# Patient Record
Sex: Female | Born: 1955 | Race: White | Hispanic: No | Marital: Single | Smoking: Current every day smoker
Health system: Southern US, Community
[De-identification: ages and names within clinical notes are randomized; demographics above are authoritative.]

## PROBLEM LIST (undated history)

## (undated) DIAGNOSIS — I10 Essential (primary) hypertension: Secondary | ICD-10-CM

## (undated) DIAGNOSIS — K7460 Unspecified cirrhosis of liver: Secondary | ICD-10-CM

## (undated) HISTORY — PX: ABDOMINAL HYSTERECTOMY: SHX81

---

## 2005-01-17 ENCOUNTER — Emergency Department: Payer: Self-pay | Admitting: Emergency Medicine

## 2005-07-24 ENCOUNTER — Emergency Department: Payer: Self-pay | Admitting: Emergency Medicine

## 2006-02-10 ENCOUNTER — Emergency Department: Payer: Self-pay | Admitting: Internal Medicine

## 2010-04-03 ENCOUNTER — Emergency Department: Payer: Self-pay | Admitting: Emergency Medicine

## 2010-12-31 ENCOUNTER — Emergency Department: Payer: Self-pay | Admitting: Emergency Medicine

## 2011-06-16 ENCOUNTER — Emergency Department: Payer: Self-pay | Admitting: Emergency Medicine

## 2011-06-30 ENCOUNTER — Inpatient Hospital Stay: Payer: Self-pay | Admitting: Internal Medicine

## 2011-07-06 ENCOUNTER — Encounter: Payer: Self-pay | Admitting: *Deleted

## 2011-07-06 ENCOUNTER — Observation Stay (HOSPITAL_COMMUNITY)
Admission: EM | Admit: 2011-07-06 | Discharge: 2011-07-08 | Disposition: A | Discharge status: Nursing Home (Includes ICF) | Payer: Medicaid Other | Attending: Internal Medicine | Admitting: Internal Medicine

## 2011-07-06 DIAGNOSIS — K7460 Unspecified cirrhosis of liver: Secondary | ICD-10-CM | POA: Diagnosis present

## 2011-07-06 DIAGNOSIS — Z23 Encounter for immunization: Secondary | ICD-10-CM | POA: Insufficient documentation

## 2011-07-06 DIAGNOSIS — R0602 Shortness of breath: Secondary | ICD-10-CM | POA: Insufficient documentation

## 2011-07-06 DIAGNOSIS — Z9181 History of falling: Secondary | ICD-10-CM | POA: Insufficient documentation

## 2011-07-06 DIAGNOSIS — R5381 Other malaise: Secondary | ICD-10-CM | POA: Insufficient documentation

## 2011-07-06 DIAGNOSIS — F17200 Nicotine dependence, unspecified, uncomplicated: Secondary | ICD-10-CM | POA: Insufficient documentation

## 2011-07-06 DIAGNOSIS — R531 Weakness: Secondary | ICD-10-CM

## 2011-07-06 DIAGNOSIS — D649 Anemia, unspecified: Secondary | ICD-10-CM | POA: Diagnosis present

## 2011-07-06 DIAGNOSIS — R188 Other ascites: Secondary | ICD-10-CM | POA: Diagnosis present

## 2011-07-06 DIAGNOSIS — R5383 Other fatigue: Secondary | ICD-10-CM | POA: Insufficient documentation

## 2011-07-06 DIAGNOSIS — R262 Difficulty in walking, not elsewhere classified: Secondary | ICD-10-CM | POA: Insufficient documentation

## 2011-07-06 DIAGNOSIS — F1020 Alcohol dependence, uncomplicated: Secondary | ICD-10-CM

## 2011-07-06 DIAGNOSIS — K7030 Alcoholic cirrhosis of liver without ascites: Secondary | ICD-10-CM | POA: Insufficient documentation

## 2011-07-06 DIAGNOSIS — N390 Urinary tract infection, site not specified: Secondary | ICD-10-CM | POA: Insufficient documentation

## 2011-07-06 DIAGNOSIS — M545 Low back pain, unspecified: Secondary | ICD-10-CM

## 2011-07-06 DIAGNOSIS — I10 Essential (primary) hypertension: Secondary | ICD-10-CM | POA: Insufficient documentation

## 2011-07-06 DIAGNOSIS — R110 Nausea: Secondary | ICD-10-CM | POA: Insufficient documentation

## 2011-07-06 DIAGNOSIS — W19XXXA Unspecified fall, initial encounter: Secondary | ICD-10-CM | POA: Diagnosis present

## 2011-07-06 HISTORY — DX: Essential (primary) hypertension: I10

## 2011-07-06 HISTORY — DX: Unspecified cirrhosis of liver: K74.60

## 2011-07-06 LAB — PROTIME-INR: Prothrombin Time: 20.3 seconds — ABNORMAL HIGH (ref 11.6–15.2)

## 2011-07-06 LAB — AMMONIA: Ammonia: 16 umol/L (ref 11–60)

## 2011-07-06 LAB — BASIC METABOLIC PANEL
CO2: 20 mEq/L (ref 19–32)
Chloride: 104 mEq/L (ref 96–112)
Creatinine, Ser: 1.01 mg/dL (ref 0.50–1.10)
Glucose, Bld: 136 mg/dL — ABNORMAL HIGH (ref 70–99)

## 2011-07-06 LAB — CBC
Hemoglobin: 10.5 g/dL — ABNORMAL LOW (ref 12.0–15.0)
MCH: 36.3 pg — ABNORMAL HIGH (ref 26.0–34.0)
MCHC: 34.9 g/dL (ref 30.0–36.0)
Platelets: 110 10*3/uL — ABNORMAL LOW (ref 150–400)
RDW: 17.9 % — ABNORMAL HIGH (ref 11.5–15.5)

## 2011-07-06 LAB — HEPATIC FUNCTION PANEL
ALT: 45 U/L — ABNORMAL HIGH (ref 0–35)
Albumin: 2.7 g/dL — ABNORMAL LOW (ref 3.5–5.2)
Alkaline Phosphatase: 70 U/L (ref 39–117)
Indirect Bilirubin: 2.2 mg/dL — ABNORMAL HIGH (ref 0.3–0.9)
Total Bilirubin: 3.3 mg/dL — ABNORMAL HIGH (ref 0.3–1.2)
Total Protein: 7 g/dL (ref 6.0–8.3)

## 2011-07-06 LAB — DIFFERENTIAL
Basophils Absolute: 0.1 10*3/uL (ref 0.0–0.1)
Basophils Relative: 1 % (ref 0–1)
Eosinophils Absolute: 0.3 10*3/uL (ref 0.0–0.7)
Monocytes Relative: 18 % — ABNORMAL HIGH (ref 3–12)
Neutro Abs: 5.6 10*3/uL (ref 1.7–7.7)
Neutrophils Relative %: 61 % (ref 43–77)

## 2011-07-06 LAB — SAMPLE TO BLOOD BANK

## 2011-07-06 LAB — RAPID URINE DRUG SCREEN, HOSP PERFORMED
Amphetamines: NOT DETECTED
Barbiturates: NOT DETECTED
Benzodiazepines: POSITIVE — AB

## 2011-07-06 LAB — CARDIAC PANEL(CRET KIN+CKTOT+MB+TROPI): Relative Index: 4.3 — ABNORMAL HIGH (ref 0.0–2.5)

## 2011-07-06 MED ORDER — SODIUM CHLORIDE 0.9 % IV SOLN
INTRAVENOUS | Status: AC
  Administered 2011-07-06: 22:00:00 via INTRAVENOUS

## 2011-07-06 MED ORDER — MORPHINE SULFATE 2 MG/ML IJ SOLN
2.0000 mg | Freq: Once | INTRAMUSCULAR | Status: AC
  Administered 2011-07-06: 2 mg via INTRAVENOUS
  Filled 2011-07-06: qty 1

## 2011-07-06 NOTE — ED Notes (Signed)
The pt was discharged from [facility] yesterday and the daughter with her wants nursing home placement because she cannot care for herself and the daughter says she cannot stay with her.  She has had numerus complaints.  She has been having diarrhea and she has no control over her bowels or bladder.  The pt has cirrhosis

## 2011-07-06 NOTE — ED Notes (Signed)
Urine specimen collected and sent to lab. Awaiting results.

## 2011-07-06 NOTE — ED Notes (Signed)
MD at bedside. 

## 2011-07-06 NOTE — ED Notes (Signed)
Soda provided per Pt request.

## 2011-07-06 NOTE — ED Notes (Signed)
Attempted to call report - nuse unavailable at present - in report - will call later.

## 2011-07-06 NOTE — ED Notes (Signed)
Spoke with pt's daughter in Triage while pt waiting to be seen.  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  Dtr. Reports that she lives in a roommate situation and cannot care for pt in that environment.  Dtr. Works and goes to school and no one else in the family willing to care for pt.  Per dtr., pt is incontinent of urine/stool and has limited bed mobility/ambulation ability.  Pt would be inappropriate for homeless shelter if the above info is accurate.  Pt's dtr. Believes that pt needs NH/ALF placement at this point because she has no one to care for her and she cannot care for herself.  Pt is in the process of applying for Medicaid/disability and would require a letter of guarantee from [facility] for placement.  Emotional support given to pt's dtr.

## 2011-07-06 NOTE — ED Notes (Signed)
Took Pt to bathroom via wheelchair to collect urine specimen.  Hat placed inm toilet - Pt missed hat altogether.  Will attempt again with bed pan.

## 2011-07-06 NOTE — H&P (Signed)
Alyssa Soto is an 55 y.o. female.   Chief Complain: Weakness HPI: 55 year old female, who was just discharged from [facility] yesterday after being treated for hepatic encephalopathy and alcohol withdrawal came to the ER today because of weakness and inability to take care of herself. Patient states she was at the [facility] for a week. She did not know when she had gone there because of the confusion. Presently she has been treated with Lasix and lactulose patient is also on Levaquin for UTI. Patient states she's been feeling weak and difficult to walk and she has had falls. Patient states she does have low back pain which has been ongoing for last 2-3 weeks. Patient states she had CAT scan done for her back at the [facility] which as per patient did not show anything acute. Patient still has low back pain when she moves or puts pressure. She denies any chest pain shortness of breath cough or phlegm abdominal pain fever chills or diarrhea. She does have distended abdomen denies any pain at this time.  Past Medical History  Diagnosis Date  . Liver cirrhosis   . Hypertension     Past Surgical History  Procedure Date  . Abdominal hysterectomy     Family History  Problem Relation Age of Onset  . Coronary artery disease Father   . Diabetes type II Sister    Social History:  reports that she has been smoking.  She does not have any smokeless tobacco history on file. She reports that she drinks alcohol. Her drug history not on file.  Allergies:  Allergies  Allergen Reactions  . Acetaminophen     Due to liver condition  . Ampicillin Rash  . Codeine Rash    Medications Prior to Admission  Medication Dose Route Frequency Provider Last Rate Last Dose  . 0.9 %  sodium chloride infusion   Intravenous STAT Geoffery Lyons, MD 100 mL/hr at 07/06/11 2213    . morphine 2 MG/ML injection 2 mg  2 mg Intravenous Once Geoffery Lyons, MD   2 mg at 07/06/11 2214   No current  outpatient prescriptions on file as of 07/06/2011.    Results for orders placed during the hospital encounter of 07/06/11 (from the past 48 hour(s))  CBC     Status: Abnormal   Collection Time   07/06/11  6:45 PM      Component Value Range Comment   WBC 9.1  4.0 - 10.5 (K/uL)    RBC 2.89 (*) 3.87 - 5.11 (MIL/uL)    Hemoglobin 10.5 (*) 12.0 - 15.0 (g/dL)    HCT 51.8 (*) 84.1 - 46.0 (%)    MCV 104.2 (*) 78.0 - 100.0 (fL)    MCH 36.3 (*) 26.0 - 34.0 (pg)    MCHC 34.9  30.0 - 36.0 (g/dL)    RDW 66.0 (*) 63.0 - 15.5 (%)    Platelets 110 (*) 150 - 400 (K/uL) PLATELET COUNT CONFIRMED BY SMEAR  DIFFERENTIAL     Status: Abnormal   Collection Time   07/06/11  6:45 PM      Component Value Range Comment   Neutrophils Relative 61  43 - 77 (%)    Neutro Abs 5.6  1.7 - 7.7 (K/uL)    Lymphocytes Relative 17  12 - 46 (%)    Lymphs Abs 1.6  0.7 - 4.0 (K/uL)    Monocytes Relative 18 (*) 3 - 12 (%)    Monocytes Absolute 1.7 (*) 0.1 -  1.0 (K/uL)    Eosinophils Relative 3  0 - 5 (%)    Eosinophils Absolute 0.3  0.0 - 0.7 (K/uL)    Basophils Relative 1  0 - 1 (%)    Basophils Absolute 0.1  0.0 - 0.1 (K/uL)   CARDIAC PANEL(CRET KIN+CKTOT+MB+TROPI)     Status: Abnormal   Collection Time   07/06/11  6:48 PM      Component Value Range Comment   Total CK 113  7 - 177 (U/L)    CK, MB 4.9 (*) 0.3 - 4.0 (ng/mL)    Troponin I <0.30  <0.30 (ng/mL)    Relative Index 4.3 (*) 0.0 - 2.5    SAMPLE TO BLOOD BANK     Status: Normal   Collection Time   07/06/11  7:00 PM      Component Value Range Comment   Blood Bank Specimen SAMPLE AVAILABLE FOR TESTING      Sample Expiration 07/07/2011     BASIC METABOLIC PANEL     Status: Abnormal   Collection Time   07/06/11  7:08 PM      Component Value Range Comment   Sodium 133 (*) 135 - 145 (mEq/L)    Potassium 4.1  3.5 - 5.1 (mEq/L)    Chloride 104  96 - 112 (mEq/L)    CO2 20  19 - 32 (mEq/L)    Glucose, Bld 136 (*) 70 - 99 (mg/dL)    BUN 13  6 - 23 (mg/dL)     Creatinine, Ser 1.61  0.50 - 1.10 (mg/dL)    Calcium 8.9  8.4 - 10.5 (mg/dL)    GFR calc non Af Amer 62 (*) >90 (mL/min)    GFR calc Af Amer 72 (*) >90 (mL/min)   HEPATIC FUNCTION PANEL     Status: Abnormal   Collection Time   07/06/11  7:08 PM      Component Value Range Comment   Total Protein 7.0  6.0 - 8.3 (g/dL)    Albumin 2.7 (*) 3.5 - 5.2 (g/dL)    AST 096 (*) 0 - 37 (U/L)    ALT 45 (*) 0 - 35 (U/L)    Alkaline Phosphatase 70  39 - 117 (U/L)    Total Bilirubin 3.3 (*) 0.3 - 1.2 (mg/dL)    Bilirubin, Direct 1.1 (*) 0.0 - 0.3 (mg/dL)    Indirect Bilirubin 2.2 (*) 0.3 - 0.9 (mg/dL)   ETHANOL     Status: Normal   Collection Time   07/06/11  7:08 PM      Component Value Range Comment   Alcohol, Ethyl (B) <11  0 - 11 (mg/dL)   PROTIME-INR     Status: Abnormal   Collection Time   07/06/11  7:08 PM      Component Value Range Comment   Prothrombin Time 20.3 (*) 11.6 - 15.2 (seconds)    INR 1.70 (*) 0.00 - 1.49    APTT     Status: Normal   Collection Time   07/06/11  7:08 PM      Component Value Range Comment   aPTT 34  24 - 37 (seconds)   AMMONIA     Status: Normal   Collection Time   07/06/11  7:10 PM      Component Value Range Comment   Ammonia 16  11 - 60 (umol/L)   URINE RAPID DRUG SCREEN (HOSP PERFORMED)     Status: Abnormal   Collection Time   07/06/11  8:05  PM      Component Value Range Comment   Opiates NONE DETECTED  NONE DETECTED     Cocaine NONE DETECTED  NONE DETECTED     Benzodiazepines POSITIVE (*) NONE DETECTED     Amphetamines NONE DETECTED  NONE DETECTED     Tetrahydrocannabinol NONE DETECTED  NONE DETECTED     Barbiturates NONE DETECTED  NONE DETECTED     No results found.  Review of Systems  Constitutional: Positive for malaise/fatigue.  HENT: Negative.   Eyes: Negative.   Respiratory: Negative.   Cardiovascular: Negative.   Gastrointestinal: Negative.   Genitourinary: Negative.   Musculoskeletal: Positive for back pain.  Skin: Negative.     Neurological: Positive for weakness.  Endo/Heme/Allergies: Negative.   Psychiatric/Behavioral: Negative.     Blood pressure 124/73, pulse 96, temperature 98.8 F (37.1 C), temperature source Oral, resp. rate 20, SpO2 96.00%. Physical Exam  Constitutional: She is oriented to person, place, and time. She appears well-developed and well-nourished.  HENT:  Head: Normocephalic and atraumatic.  Right Ear: External ear normal.  Left Ear: External ear normal.  Nose: Nose normal.  Mouth/Throat: Oropharynx is clear and moist.  Eyes: Conjunctivae and EOM are normal. Pupils are equal, round, and reactive to light.  Neck: Normal range of motion. Neck supple.  Cardiovascular: Normal rate, regular rhythm, normal heart sounds and intact distal pulses.   Respiratory: Effort normal and breath sounds normal.  GI: Soft. Bowel sounds are normal. There is no tenderness.       Distended abdomen.  Musculoskeletal: Normal range of motion.  Neurological: She is alert and oriented to person, place, and time. She has normal reflexes.  Skin: Skin is warm and dry.  Psychiatric: Her behavior is normal.     Assessment/Plan #1. Generalized weakness from deconditioning. #2. Alcoholic liver cirrhosis. #3. Recently discharged from [facility] after being treated for Hepatic encephalopathy and alcohol withdrawal. #4. Mild coagulopathy and thrombocytopenia probably from cirrhosis of liver. #5. Recently treated UTI on Levaquin. #6. Low back pain. #7. Ascites. #8. Cigarette smoker.  Plan Will admit to medical floor. For her weakness which I think is from deconditioning we'll get PT consult. Patient does have ascites with distended abdomen. Abdomen is nontender, patient is afebrile. We'll order sonogram guided paracentesis and send fluid for cell count and differential and for other labs. For now we will continue her Levaquin, Lasix and lactulose. Further recommendations as condition  evolves.     Eduard Clos 07/06/2011, 10:56 PM

## 2011-07-06 NOTE — ED Provider Notes (Signed)
History     CSN: [id number] Arrival date & time: 07/06/2011  4:05 PM   First MD Initiated Contact with Patient 07/06/11 1843      Chief Complaint  Patient presents with  . Back Pain    (Consider location/radiation/quality/duration/timing/severity/associated sxs/prior treatment) HPI Comments: This patient was diagnosed two weeks ago with advanced cirrhosis at [facility].  She was to be transferred to inpatient alcohol treatment, however they were unable to arrange placement.  She was discharged yesterday.  Since yesterday, the patient has been unable to walk or care for self.  She is having diarrhea from her lactulose and the daughter is unable to care for her.    Patient is a 55 y.o. female presenting with weakness. The history is provided by the patient and a relative.  Weakness The primary symptoms include nausea.  Additional symptoms include weakness.    Past Medical History  Diagnosis Date  . Liver cirrhosis   . Hypertension     History reviewed. No pertinent past surgical history.  History reviewed. No pertinent family history.  History  Substance Use Topics  . Smoking status: Current Everyday Smoker  . Smokeless tobacco: Not on file  . Alcohol Use: Yes    OB History    Grav Para Term Preterm Abortions TAB SAB Ect Mult Living                  Review of Systems  Gastrointestinal: Positive for nausea.  Neurological: Positive for weakness.  All other systems reviewed and are negative.    Allergies  Acetaminophen; Ampicillin; and Codeine  Home Medications   Current Outpatient Rx  Name Route Sig Dispense Refill  . CITALOPRAM HYDROBROMIDE 20 MG PO TABS Oral Take 20 mg by mouth daily.      Marland Kitchen FOLIC ACID 1 MG PO TABS Oral Take 1 mg by mouth daily.      . FUROSEMIDE 40 MG PO TABS Oral Take 40 mg by mouth 2 (two) times daily.      . IBUPROFEN 200 MG PO TABS Oral Take 200 mg by mouth every 8 (eight) hours as needed. pain     . LACTULOSE 10 GM/15ML PO  SOLN Oral Take 10 g by mouth 3 (three) times daily. For bowel movements     . LEVOFLOXACIN 250 MG PO TABS Oral Take 250 mg by mouth daily. Take for 3 days beginning 07/05/11     . THERA M PLUS PO TABS Oral Take 1 tablet by mouth 2 (two) times daily.      Marland Kitchen POTASSIUM CHLORIDE CRYS CR 20 MEQ PO TBCR Oral Take 20 mEq by mouth 2 (two) times daily.        BP 153/75  Pulse 87  Temp(Src) 99 F (37.2 C) (Oral)  Resp 16  SpO2 97%  Physical Exam  Constitutional: She is oriented to person, place, and time.       Pale, mildly jaundiced.  HENT:  Head: Normocephalic and atraumatic.  Eyes: Pupils are equal, round, and reactive to light. Scleral icterus is present.  Neck: Normal range of motion. Neck supple.  Cardiovascular: Normal rate and regular rhythm.  Exam reveals no gallop and no friction rub.   No murmur heard. Pulmonary/Chest: Effort normal and breath sounds normal. No respiratory distress. She has no wheezes. She has no rales.  Abdominal: She exhibits distension.  Musculoskeletal: Normal range of motion.  Neurological: She is alert and oriented to person, place, and time.  Skin:  Jaundiced.    ED Course  Procedures (including critical care time)   Labs Reviewed  CBC  DIFFERENTIAL  COMPREHENSIVE METABOLIC PANEL  SAMPLE TO BLOOD BANK  CARDIAC PANEL(CRET KIN+CKTOT+MB+TROPI)   No results found.   No diagnosis found.    MDM  Patient was recently diagnosed with end stage liver failure.  She is too weak to get around on her own and the daughter is unable to care for her.  The labs reflect advanced liver disease but show no acute process.  Will consult medicine for admission.        Geoffery Lyons, MD 07/06/11 2158

## 2011-07-07 ENCOUNTER — Inpatient Hospital Stay (HOSPITAL_COMMUNITY): Payer: Medicaid Other

## 2011-07-07 DIAGNOSIS — W19XXXA Unspecified fall, initial encounter: Secondary | ICD-10-CM | POA: Diagnosis present

## 2011-07-07 LAB — CBC
Hemoglobin: 9.5 g/dL — ABNORMAL LOW (ref 12.0–15.0)
MCH: 35.6 pg — ABNORMAL HIGH (ref 26.0–34.0)
MCV: 104.5 fL — ABNORMAL HIGH (ref 78.0–100.0)
Platelets: 104 10*3/uL — ABNORMAL LOW (ref 150–400)
RBC: 2.67 MIL/uL — ABNORMAL LOW (ref 3.87–5.11)
WBC: 7.7 10*3/uL (ref 4.0–10.5)

## 2011-07-07 LAB — COMPREHENSIVE METABOLIC PANEL
AST: 92 U/L — ABNORMAL HIGH (ref 0–37)
Albumin: 2.4 g/dL — ABNORMAL LOW (ref 3.5–5.2)
BUN: 12 mg/dL (ref 6–23)
Calcium: 8.2 mg/dL — ABNORMAL LOW (ref 8.4–10.5)
Chloride: 109 mEq/L (ref 96–112)
Creatinine, Ser: 1.05 mg/dL (ref 0.50–1.10)
Total Bilirubin: 2.7 mg/dL — ABNORMAL HIGH (ref 0.3–1.2)

## 2011-07-07 LAB — PROTIME-INR
INR: 1.85 — ABNORMAL HIGH (ref 0.00–1.49)
Prothrombin Time: 21.7 seconds — ABNORMAL HIGH (ref 11.6–15.2)

## 2011-07-07 LAB — MAGNESIUM: Magnesium: 1.5 mg/dL (ref 1.5–2.5)

## 2011-07-07 LAB — AMMONIA: Ammonia: 27 umol/L (ref 11–60)

## 2011-07-07 MED ORDER — CITALOPRAM HYDROBROMIDE 20 MG PO TABS
20.0000 mg | ORAL_TABLET | Freq: Every day | ORAL | Status: DC
  Administered 2011-07-07 – 2011-07-08 (×2): 20 mg via ORAL
  Filled 2011-07-07 (×2): qty 1

## 2011-07-07 MED ORDER — MORPHINE SULFATE 2 MG/ML IJ SOLN
2.0000 mg | Freq: Once | INTRAMUSCULAR | Status: AC
  Administered 2011-07-07: 2 mg via INTRAVENOUS
  Filled 2011-07-07: qty 1

## 2011-07-07 MED ORDER — SODIUM CHLORIDE 0.9 % IV SOLN: 250.0000 mL | INTRAVENOUS | Status: DC | PRN

## 2011-07-07 MED ORDER — SODIUM CHLORIDE 0.9 % IJ SOLN
3.0000 mL | Freq: Two times a day (BID) | INTRAMUSCULAR | Status: DC
  Administered 2011-07-07 – 2011-07-08 (×2): 3 mL via INTRAVENOUS

## 2011-07-07 MED ORDER — OXYCODONE HCL 5 MG PO TABS
5.0000 mg | ORAL_TABLET | Freq: Once | ORAL | Status: AC
  Administered 2011-07-08: 5 mg via ORAL
  Filled 2011-07-07 (×2): qty 1

## 2011-07-07 MED ORDER — SODIUM CHLORIDE 0.9 % IJ SOLN
3.0000 mL | INTRAMUSCULAR | Status: DC | PRN
  Administered 2011-07-08: 3 mL via INTRAVENOUS

## 2011-07-07 MED ORDER — ONDANSETRON HCL 4 MG PO TABS: 4.0000 mg | ORAL_TABLET | Freq: Four times a day (QID) | ORAL | Status: DC | PRN

## 2011-07-07 MED ORDER — LEVOFLOXACIN 250 MG PO TABS
250.0000 mg | ORAL_TABLET | Freq: Every day | ORAL | Status: DC
  Administered 2011-07-07 – 2011-07-08 (×2): 250 mg via ORAL
  Filled 2011-07-07 (×2): qty 1

## 2011-07-07 MED ORDER — TUBERCULIN PPD 5 UNIT/0.1ML ID SOLN
5.0000 [IU] | Freq: Once | INTRADERMAL | Status: AC
  Administered 2011-07-07: 5 [IU] via INTRADERMAL
  Filled 2011-07-07: qty 0.1

## 2011-07-07 MED ORDER — MORPHINE SULFATE 2 MG/ML IJ SOLN
0.5000 mg | Freq: Once | INTRAMUSCULAR | Status: AC
  Administered 2011-07-07: 02:00:00 via INTRAVENOUS
  Filled 2011-07-07: qty 1

## 2011-07-07 MED ORDER — FUROSEMIDE 40 MG PO TABS
40.0000 mg | ORAL_TABLET | Freq: Two times a day (BID) | ORAL | Status: DC
  Administered 2011-07-07 – 2011-07-08 (×3): 40 mg via ORAL
  Filled 2011-07-07 (×5): qty 1

## 2011-07-07 MED ORDER — ONDANSETRON HCL 4 MG/2ML IJ SOLN: 4.0000 mg | Freq: Four times a day (QID) | INTRAMUSCULAR | Status: DC | PRN

## 2011-07-07 MED ORDER — PNEUMOCOCCAL VAC POLYVALENT 25 MCG/0.5ML IJ INJ
0.5000 mL | INJECTION | INTRAMUSCULAR | Status: AC
  Administered 2011-07-08: 0.5 mL via INTRAMUSCULAR
  Filled 2011-07-07: qty 0.5

## 2011-07-07 MED ORDER — IBUPROFEN 200 MG PO TABS
200.0000 mg | ORAL_TABLET | Freq: Three times a day (TID) | ORAL | Status: DC | PRN
  Administered 2011-07-07 – 2011-07-08 (×3): 200 mg via ORAL
  Filled 2011-07-07 (×3): qty 1

## 2011-07-07 MED ORDER — MORPHINE SULFATE 15 MG PO TABS
15.0000 mg | ORAL_TABLET | Freq: Once | ORAL | Status: AC
  Administered 2011-07-07: 15 mg via ORAL
  Filled 2011-07-07: qty 1

## 2011-07-07 MED ORDER — FOLIC ACID 1 MG PO TABS
1.0000 mg | ORAL_TABLET | Freq: Every day | ORAL | Status: DC
  Administered 2011-07-07 – 2011-07-08 (×2): 1 mg via ORAL
  Filled 2011-07-07 (×2): qty 1

## 2011-07-07 MED ORDER — THERA M PLUS PO TABS
1.0000 | ORAL_TABLET | Freq: Two times a day (BID) | ORAL | Status: DC
  Administered 2011-07-07 – 2011-07-08 (×3): 1 via ORAL
  Filled 2011-07-07 (×4): qty 1

## 2011-07-07 MED ORDER — POTASSIUM CHLORIDE CRYS ER 20 MEQ PO TBCR
20.0000 meq | EXTENDED_RELEASE_TABLET | Freq: Two times a day (BID) | ORAL | Status: DC
  Administered 2011-07-07 – 2011-07-08 (×3): 20 meq via ORAL
  Filled 2011-07-07 (×4): qty 1

## 2011-07-07 MED ORDER — LACTULOSE 10 GM/15ML PO SOLN
10.0000 g | Freq: Three times a day (TID) | ORAL | Status: DC
  Administered 2011-07-07 – 2011-07-08 (×4): 10 g via ORAL
  Filled 2011-07-07 (×6): qty 15

## 2011-07-07 MED ORDER — WHITE PETROLATUM GEL
Status: AC
  Filled 2011-07-07: qty 5

## 2011-07-07 NOTE — Discharge Summary (Addendum)
Alyssa Soto MRN: [id number] DOB/AGE: 1956/06/07 55 y.o.  Admit date: 07/06/2011 Discharge date: 07/07/2011. Patient initially scheduled for discharge on 06/29/2011 however facility could not accept her until 07/08/2011 this patient be discharged on 07/08/2011.  Primary Care Physician:  No primary provider on file.   Discharge Diagnoses:   Patient Active Problem List  Diagnoses  . Weakness  . Cirrhosis of liver  . Anemia  . Ascites  . Low back pain  . Fall    DISCHARGE MEDICATION: Current Discharge Medication List    CONTINUE these medications which have NOT CHANGED   Details  citalopram (CELEXA) 20 MG tablet Take 20 mg by mouth daily.      folic acid (FOLVITE) 1 MG tablet Take 1 mg by mouth daily.      furosemide (LASIX) 40 MG tablet Take 40 mg by mouth 2 (two) times daily.      ibuprofen (ADVIL,MOTRIN) 200 MG tablet Take 200 mg by mouth every 8 (eight) hours as needed. pain     lactulose (CHRONULAC) 10 GM/15ML solution Take 10 g by mouth 3 (three) times daily. For bowel movements     levofloxacin (LEVAQUIN) 250 MG tablet Take 250 mg by mouth daily. Take for 3 days beginning 07/05/11     Multiple Vitamins-Minerals (MULTIVITAMINS THER. W/MINERALS) TABS Take 1 tablet by mouth 2 (two) times daily.      potassium chloride SA (K-DUR,KLOR-CON) 20 MEQ tablet Take 20 mEq by mouth 2 (two) times daily.              BRIEF ADMITTING H & P:55 year old female, who was just discharged from [facility] yesterday after being treated for hepatic encephalopathy and alcohol withdrawal came to the ER today because of weakness and inability to take care of herself. Patient states she was at the [facility] for a week. She did not know when she had gone there because of the confusion over per the patient and her daughter at the time of discharge she was completely clear and her mentation. The daughter's motivation for bringing the patient to [facility] is that she  states that the patient has no place to stay and could not stay with her daughter as she would be violation of her lease as the patient has a infectious disease in the form of hepatitis C as reported by the patient and her daughter. According to the patient's daughter she noted that the patient had weakness could not get in and out of bed by herself and had an accident a bowel movement on the floor when she tried to clean up and then could not get up from the floor.  Presently she has been treated with Lasix and lactulose and patient is also on Levaquin for UTI. Patient states she's been feeling weak and difficult to walk and she has had falls. Patient states she does have low back pain which has been ongoing for last 2-3 months. Patient states she had CAT scan done for her back at the [facility] which as per patient did not show anything acute. She denies any chest pain shortness of breath cough or phlegm abdominal pain fever chills or diarrhea. She does have distended abdomen denies any pain at this time.    Hospital Course:  Present on Admission:  .Weakness: The patient's demonstrates no acute weakness. She's been seen by physical therapy. The patient is ambulating the hall without any assistance and was able to perform bed mobility without any assistance. I also  discussed the patient's ambulation and bed mobility washes were physical therapy.  .Cirrhosis of liver: The patient has a diagnosis of cirrhosis. The patient is well compensated at this time. She has no abdominal pain except for some mild liver tenderness. The patient has good oral intake and a GI lamination without any difficulty and she has no respiratory embarrassment. .Anemia: The patient has no evidence of active bleeding and her hemoglobin is stable between yesterday and today.  .Ascites: Clinically the patient has ascites as is expected with her cirrhosis and hepatitis. She has an appointment to followup with outpatient GI in 2  weeks.  .Fall: Per report of the patient's daughter the patient did not actually fall but that down on the floor to clean up her stool and could not get up. EtOH habituation: The patient has been counseled against any further alcohol use. UTI: The patient was diagnosed with a urinary tract infection mild [facility]. She was started on Levaquin on 11/26 to complete at 11/29. Psychosocial: Presently this patient has no acute medical needs. However the patient also appropriately discharged by [facility] has no place to stay. The patient apparently was living with a friend and has lost her prehospital living arrangement and her daughter is unable to provide accommodations for her to stay. I've asked the social worker to see the patient to assist in discharging the patient.  Patient is medically stable for discharge.  Disposition and Follow-up: Patient being discharged to [facility] assisted living facility. The patient has a followup appointment with [facility] on 07/19/2011 at 12 noon. This is been confirmed with her daughter Ms. Benito Mccreedy.   DISCHARGE EXAM: Blood pressure 104/66, pulse 91, temperature 99.3 F (37.4 C), temperature source Oral, resp. rate 20, height 5\' 2"  (1.575 m), weight 56.5 kg (124 lb 9 oz), SpO2 93.00%. General: Alert, awake, oriented x3, in no acute distress.  HEENT: [place]/AT PEERL, EOMI Neck: Trachea midline,  no masses, no thyromegal,y no JVD, no carotid bruit OROPHARYNX:  Moist, No exudate/ erythema/lesions.  Heart: Regular rate and rhythm, without murmurs, rubs, gallops, PMI non-displaced, no heaves or thrills on palpation.  Lungs: Clear to auscultation, no wheezing or rhonchi noted. No increased vocal fremitus resonant to percussion  Abdomen: Soft, mild tenderness over the liver. Patient has clinical ascites, positive bowel sounds, no masses, however enlarged liver  noted.  Neuro: No focal neurological deficits noted cranial nerves II  through XII grossly intact. DTRs 2+ bilaterally upper and lower extremities. Strength 3+/ 5 in bilateral upper and lower extremities. Musculoskeletal: No warm swelling or erythema around joints, no spinal tenderness noted. Psychiatric: Patient alert and oriented x3, good insight and cognition, good recent to remote recall. Lymph node survey: No cervical axillary or inguinal lymphadenopathy noted.   Basename 07/07/11 0605 07/06/11 1908  NA 138 133*  K 3.7 4.1  CL 109 104  CO2 20 20  GLUCOSE 125* 136*  BUN 12 13  CREATININE 1.05 1.01  CALCIUM 8.2* 8.9  MG 1.5 --  PHOS -- --    [facility] 07/07/11 0605 07/06/11 1908  AST 92* 106*  ALT 42* 45*  ALKPHOS 67 70  BILITOT 2.7* 3.3*  PROT 6.3 7.0  ALBUMIN 2.4* 2.7*   No results found for this basename: LIPASE:2,AMYLASE:2 in the last 72 hours  Basename 07/07/11 0605 07/06/11 1845  WBC 7.7 9.1  NEUTROABS -- 5.6  HGB 9.5* 10.5*  HCT 27.9* 30.1*  MCV 104.5* 104.2*  PLT 104* 110*  Signed: Liston Thum A. 07/07/2011, 3:03 PM

## 2011-07-07 NOTE — Progress Notes (Signed)
Physical Therapy Evaluation Patient Details Name: KASHARA BLOCHER MRN: [id number] DOB: 01/28/1956 Today's Date: 07/07/2011  Problem List:  Patient Active Problem List  Diagnoses  . Weakness  . Cirrhosis of liver  . Anemia  . Ascites  . Low back pain  . Fall    Past Medical History:  Past Medical History  Diagnosis Date  . Liver cirrhosis   . Hypertension    Past Surgical History:  Past Surgical History  Procedure Date  . Abdominal hysterectomy     PT Assessment/Plan/Recommendation PT Assessment Clinical Impression Statement: Pt presents with difficulty donning sock and shoes secondary to Low back pain..  No significant impairment to mobility.   PT Recommendation/Assessment: Patent does not need any further PT services No Skilled PT: Patient is independent with all acitivity/mobility PT Goals     PT Evaluation Precautions/Restrictions  Precautions Precautions: Fall Restrictions Weight Bearing Restrictions: No Prior Functioning  Home Living Lives With: Alone (may go to daughters home) Receives Help From: Other (Comment) (unclear of Dishcharge enviroment may not stay with daughter) Type of Home: Other (Comment) Home Layout: Other (Comment) Prior Function Able to Take Stairs?: Yes Driving: Yes Cognition Cognition Arousal/Alertness: Awake/alert Overall Cognitive Status: Appears within functional limits for tasks assessed Sensation/Coordination Sensation Light Touch: Appears Intact Coordination Gross Motor Movements are Fluid and Coordinated: Yes Extremity Assessment RUE Assessment RUE Assessment: Within Functional Limits LUE Assessment LUE Assessment: Within Functional Limits RLE Assessment RLE Assessment: Within Functional Limits LLE Assessment LLE Assessment: Within Functional Limits Mobility (including Balance) Bed Mobility Bed Mobility: Yes Rolling Right: 7: Independent Rolling Left: 7: Independent Left Sidelying to Sit: 7: Independent Sit to  Supine - Left: 7: Independent Transfers Transfers: Yes Sit to Stand: From bed;7: Independent Stand to Sit: To chair/3-in-1;7: Independent Ambulation/Gait Ambulation/Gait Assistance: 7: Independent Assistive device: None Gait Pattern: Within Functional Limits Gait velocity: wfl Stairs: No Wheelchair Mobility Wheelchair Mobility: No  Posture/Postural Control Posture/Postural Control: No significant limitations Balance Balance Assessed: No Exercise    End of Session PT - End of Session Equipment Utilized During Treatment: Gait belt Activity Tolerance: Patient tolerated treatment well Patient left: in bed;with call bell in reach Nurse Communication: Mobility status for transfers;Mobility status for ambulation General Behavior During Session: [facility] for tasks performed Cognition: [facility] for tasks performed  Aireana Ryland L. Cabela Pacifico DPT [phone] 07/07/2011, 4:46 PM

## 2011-07-07 NOTE — Progress Notes (Signed)
Pt arrived on unit with complaints of back pain.  Bruise on rt ac and small scab on right shin otherwise skin intact. Pt is full code. Pt is alert and oriented x4.  Pt put on bed alarm due to recent fall and given a [place] sandwich.  Pt viewed safety video and refused flu vaccine but would like PNA vaccine.

## 2011-07-07 NOTE — Progress Notes (Signed)
.  Clinical social worker completed patient psychosocial assessment, please see assessment in patient shadow chart.  .Clinical social worker initiated skilled nursing facility search, see placement note in patient shadow chart.  Pt to dc to assisted living with letter of guarantee with pending medicaid and disability. Pt to meet with financial counseling at 2pm to complete pt medicaid and disability application. CSW continuing to follow.   Catha Gosselin, Theresia Majors  [phone] .07/07/2011 17:02

## 2011-07-07 NOTE — Progress Notes (Signed)
Patient is being discharged from PT services secondary to:  No acute PT needs identified.    Please see latest Therapy Progress Note for current level of functioning and progress toward goals.  Progress and discharge plan and discussed with patient/caregiver and they Agree.   Lan Mcneill L. Evanie Buckle DPT [phone] 07/07/2011

## 2011-07-07 NOTE — Progress Notes (Signed)
Pt asked for SCD's to be removed complaining of pain and inability to sleep.

## 2011-07-08 DIAGNOSIS — D689 Coagulation defect, unspecified: Secondary | ICD-10-CM | POA: Insufficient documentation

## 2011-07-08 MED ORDER — MORPHINE SULFATE 15 MG PO TABS
15.0000 mg | ORAL_TABLET | Freq: Once | ORAL | Status: AC
  Administered 2011-07-08: 15 mg via ORAL
  Filled 2011-07-08: qty 1

## 2011-07-08 MED ORDER — MORPHINE SULFATE 15 MG PO TABS: 15.0000 mg | ORAL_TABLET | ORAL | Status: DC | PRN

## 2011-07-08 NOTE — Progress Notes (Signed)
Subjective:  Patient without any complaints today. The patient is rather ebulent about the fact that she has an assisted living facility to go to. She states" now all my problems are solved". Spoke with patient's daughter Leotis Shames who confirmed [facility]. Patient denies any abdominal pain or any back pain at present.  I also spoke with Dr. Cherylann Ratel from [facility] who discharged the patient on Monday. He states that the patient had a hospitalization had no signs and symptoms of infection except for a urinary tract infection and did not receive a paracentesis at that time. The patient had an appointment to followup with a gastroenterologist at "clinic in 2 weeks. He also states that while at [facility] the patient's hospitalization was actually extended in attempts to find her appropriate placement. However her daughter accepted the patient home with her and that the patient was fully functionally mobile  without any need for therapy as evaluated by the physical therapist. The above description of the findings were confirmed by a reassessment done here at our institution which confirms no acute medical problems as well as no significant impairment of mobility.    Objective: Filed Vitals:   07/07/11 0515 07/07/11 1400 07/07/11 2100 07/08/11 0500  BP: 104/66 133/73 110/70 116/71  Pulse: 91 91 90 82  Temp: 99.3 F (37.4 C) 98.4 F (36.9 C) 98 F (36.7 C) 98.4 F (36.9 C)  TempSrc:  Oral Oral Oral  Resp: 20 20 20 20   Height:      Weight:    55 kg (121 lb 4.1 oz)  SpO2: 93% 94% 93% 94%   Weight change: -1.5 kg (-3 lb 4.9 oz)  Intake/Output Summary (Last 24 hours) at 07/08/11 0857 Last data filed at 07/08/11 0650  Gross per 24 hour  Intake    358 ml  Output      0 ml  Net    358 ml    General: Alert, awake, oriented x3, in no acute distress.  HEENT: [place]/AT PEERL, EOMI  Neck: Trachea midline, no masses, no  thyromegal,y no JVD, no carotid bruit  OROPHARYNX: Moist, No exudate/ erythema/lesions.  Heart: Regular rate and rhythm, without murmurs, rubs, gallops, PMI non-displaced, no heaves or thrills on palpation.  Lungs: Clear to auscultation, no wheezing or rhonchi noted. No increased vocal fremitus resonant to percussion  Abdomen: Soft, mild tenderness over the liver. Patient has clinical ascites, positive bowel sounds, no masses, however enlarged liver noted.  Neuro: No focal neurological deficits noted cranial nerves II through XII grossly intact. DTRs 2+ bilaterally upper and lower extremities. Strength 3+/ 5 in bilateral upper and lower extremities.  Musculoskeletal: No warm swelling or erythema around joints, no spinal tenderness noted.  Psychiatric: Patient alert and oriented x3, good insight and cognition, good recent to remote recall.  Lymph node survey: No cervical axillary or inguinal lymphadenopathy noted.    Lab Results:  [facility] 07/07/11 0605 07/06/11 1908  NA 138 133*  K 3.7 4.1  CL 109 104  CO2 20 20  GLUCOSE 125* 136*  BUN 12 13  CREATININE 1.05 1.01  CALCIUM 8.2* 8.9  MG 1.5 --  PHOS -- --    Basename 07/07/11 0605 07/06/11 1908  AST 92* 106*  ALT 42* 45*  ALKPHOS 67 70  BILITOT 2.7* 3.3*  PROT 6.3 7.0  ALBUMIN 2.4* 2.7*   No results found for this basename: LIPASE:2,AMYLASE:2 in the last 72 hours  Basename 07/07/11  9604 07/06/11 1845  WBC 7.7 9.1  NEUTROABS -- 5.6  HGB 9.5* 10.5*  HCT 27.9* 30.1*  MCV 104.5* 104.2*  PLT 104* 110*    Basename 07/06/11 1848  CKTOTAL 113  CKMB 4.9*  CKMBINDEX --  TROPONINI <0.30   No results found for this basename: POCBNP:3 in the last 72 hours No results found for this basename: DDIMER:2 in the last 72 hours No results found for this basename: HGBA1C:2 in the last 72 hours No results found for this basename: CHOL:2,HDL:2,LDLCALC:2,TRIG:2,CHOLHDL:2,LDLDIRECT:2 in the last 72 hours  Basename 07/07/11 0605  TSH  1.748  T4TOTAL --  T3FREE --  THYROIDAB --   No results found for this basename: VITAMINB12:2,FOLATE:2,FERRITIN:2,TIBC:2,IRON:2,RETICCTPCT:2 in the last 72 hours  Micro Results: No results found for this or any previous visit (from the past 240 hour(s)).  Studies/Results: X-ray Chest Pa And Lateral   07/07/2011  *RADIOLOGY REPORT*  Clinical Data: Shortness of breath, abdominal pain and back pain; history of smoking.  Assess for pleural effusion.  CHEST - 2 VIEW  Comparison: None.  Findings: The lungs are relatively well-aerated.  Linear airspace opacification at the left lung base likely reflects atelectasis. Minimal right basilar linear opacity is also seen.  There is mild blunting of both costophrenic angles, without a significant pleural effusion; this may reflect chronic pleural thickening.  No pneumothorax is seen.  An accessory azygos lobe is incidentally noted.  The cardiomediastinal silhouette is within normal limits.  No acute osseous abnormalities are seen.  IMPRESSION: Linear airspace opacification at the lung bases, more prominent on the left, likely reflecting atelectasis.  No significant pleural effusion seen.  Original Report Authenticated By: Tonia Ghent, M.D.    Medications: I have reviewed the patient's current medications. Scheduled Meds:   . citalopram  20 mg Oral Daily  . folic acid  1 mg Oral Daily  . furosemide  40 mg Oral BID  . lactulose  10 g Oral TID  . levofloxacin  250 mg Oral Daily  . morphine  15 mg Oral Once  . multivitamins ther. w/minerals  1 tablet Oral BID  . oxyCODONE  5 mg Oral Once  . pneumococcal 23 valent vaccine  0.5 mL Intramuscular Tomorrow-1000  . potassium chloride SA  20 mEq Oral BID  . sodium chloride  3 mL Intravenous Q12H  . tuberculin  5 Units Intradermal Once  . white petrolatum       Continuous Infusions:  PRN Meds:.sodium chloride, ibuprofen, ondansetron (ZOFRAN) IV, ondansetron, sodium chloride Assessment/Plan: Patient  Active Hospital Problem List: Weakness (07/06/2011): Patient has noticed significant weakness that impairs her mobility.   Cirrhosis of liver (07/06/2011): Patient has been counseled against any further alcohol use. Her liver cirrhosis is clinically stable at this time. Patient is to continue on her Lasix and her lactulose.    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    Ascites (07/06/2011): The patient clinically has ascites howeverhas no respiratory compromise and no signs and symptoms of infection   Low back pain (07/06/2011): Chronic. Does not appear to be an issue on today.    Fall (07/07/2011): As noted before patient did not sustain a fall but rather got down on the floor and could not get up.  Urinary tract infection: The patient was diagnosed with a urinary tract infection while the  regional. She was started on Levaquin to be taken from for 3 days starting on 07/05/2011. Patient's therapy will be completed after her dose of  antibiotics today.   disposition: [facility] to [facility].   LOS: 2 days

## 2011-08-20 ENCOUNTER — Ambulatory Visit: Payer: Self-pay | Admitting: Adult Health

## 2011-09-09 ENCOUNTER — Ambulatory Visit: Payer: Self-pay | Admitting: Adult Health

## 2011-09-26 ENCOUNTER — Emergency Department: Payer: Self-pay | Admitting: Emergency Medicine

## 2011-09-26 LAB — COMPREHENSIVE METABOLIC PANEL
Anion Gap: 11 (ref 7–16)
Bilirubin,Total: 2.8 mg/dL — ABNORMAL HIGH (ref 0.2–1.0)
Calcium, Total: 8.8 mg/dL (ref 8.5–10.1)
Chloride: 100 mmol/L (ref 98–107)
Co2: 27 mmol/L (ref 21–32)
Creatinine: 1.9 mg/dL — ABNORMAL HIGH (ref 0.60–1.30)
EGFR (African American): 36 — ABNORMAL LOW
EGFR (Non-African Amer.): 29 — ABNORMAL LOW
Osmolality: 280 (ref 275–301)
Potassium: 3.4 mmol/L — ABNORMAL LOW (ref 3.5–5.1)
Sodium: 138 mmol/L (ref 136–145)

## 2011-09-26 LAB — CBC
HGB: 10.8 g/dL — ABNORMAL LOW (ref 12.0–16.0)
MCV: 106 fL — ABNORMAL HIGH (ref 80–100)
Platelet: 161 10*3/uL (ref 150–440)
RBC: 3.02 10*6/uL — ABNORMAL LOW (ref 3.80–5.20)
WBC: 12.4 10*3/uL — ABNORMAL HIGH (ref 3.6–11.0)

## 2011-09-26 LAB — URINALYSIS, COMPLETE
Glucose,UR: NEGATIVE mg/dL (ref 0–75)
Leukocyte Esterase: NEGATIVE
Ph: 5 (ref 4.5–8.0)
Protein: NEGATIVE
RBC,UR: <1 /HPF (ref 0–5)

## 2011-10-14 ENCOUNTER — Ambulatory Visit: Payer: Self-pay | Admitting: Internal Medicine

## 2011-10-14 LAB — PROTIME-INR
INR: 1.4
Prothrombin Time: 17.7 secs — ABNORMAL HIGH (ref 11.5–14.7)

## 2011-11-08 ENCOUNTER — Ambulatory Visit: Payer: Self-pay | Admitting: Internal Medicine

## 2011-11-24 LAB — URINALYSIS, COMPLETE
Bacteria: NONE SEEN
Bilirubin,UR: NEGATIVE
Blood: NEGATIVE
Ph: 5 (ref 4.5–8.0)
RBC,UR: <1 /HPF (ref 0–5)
Specific Gravity: 1.02 (ref 1.003–1.030)
Squamous Epithelial: NONE SEEN

## 2011-11-24 LAB — CK TOTAL AND CKMB (NOT AT ARMC): CK, Total: 415 U/L — ABNORMAL HIGH (ref 21–215)

## 2011-11-24 LAB — CBC WITH DIFFERENTIAL/PLATELET
Basophil #: 0 10*3/uL (ref 0.0–0.1)
Basophil %: 0.1 %
Eosinophil #: 0 10*3/uL (ref 0.0–0.7)
HGB: 9.6 g/dL — ABNORMAL LOW (ref 12.0–16.0)
Lymphocyte %: 10.6 %
MCHC: 33.4 g/dL (ref 32.0–36.0)
MCV: 104 fL — ABNORMAL HIGH (ref 80–100)
Neutrophil %: 74.1 %
Platelet: 118 10*3/uL — ABNORMAL LOW (ref 150–440)
RBC: 2.75 10*6/uL — ABNORMAL LOW (ref 3.80–5.20)

## 2011-11-24 LAB — COMPREHENSIVE METABOLIC PANEL
Albumin: 2.8 g/dL — ABNORMAL LOW (ref 3.4–5.0)
Alkaline Phosphatase: 72 U/L (ref 50–136)
Anion Gap: 12 (ref 7–16)
BUN: 15 mg/dL (ref 7–18)
Bilirubin,Total: 5.1 mg/dL — ABNORMAL HIGH (ref 0.2–1.0)
Calcium, Total: 8.6 mg/dL (ref 8.5–10.1)
Co2: 24 mmol/L (ref 21–32)
EGFR (African American): 47 — ABNORMAL LOW
EGFR (Non-African Amer.): 41 — ABNORMAL LOW
Glucose: 62 mg/dL — ABNORMAL LOW (ref 65–99)
SGOT(AST): 65 U/L — ABNORMAL HIGH (ref 15–37)
SGPT (ALT): 27 U/L
Sodium: 139 mmol/L (ref 136–145)

## 2011-11-24 LAB — AMMONIA
Ammonia, Plasma: 50 mcmol/L — ABNORMAL HIGH (ref 11–32)
Ammonia, Plasma: 61 mcmol/L — ABNORMAL HIGH (ref 11–32)

## 2011-11-24 LAB — TROPONIN I: Troponin-I: <0.02 ng/mL

## 2011-11-25 ENCOUNTER — Inpatient Hospital Stay: Payer: Self-pay | Admitting: Internal Medicine

## 2011-11-25 LAB — COMPREHENSIVE METABOLIC PANEL
Albumin: 2.5 g/dL — ABNORMAL LOW (ref 3.4–5.0)
BUN: 18 mg/dL (ref 7–18)
Bilirubin,Total: 5.3 mg/dL — ABNORMAL HIGH (ref 0.2–1.0)
Chloride: 103 mmol/L (ref 98–107)
Co2: 25 mmol/L (ref 21–32)
Creatinine: 1.57 mg/dL — ABNORMAL HIGH (ref 0.60–1.30)
SGOT(AST): 78 U/L — ABNORMAL HIGH (ref 15–37)
SGPT (ALT): 30 U/L
Sodium: 138 mmol/L (ref 136–145)

## 2011-11-25 LAB — CBC WITH DIFFERENTIAL/PLATELET
Basophil #: 0 10*3/uL (ref 0.0–0.1)
Lymphocyte #: 1.9 10*3/uL (ref 1.0–3.6)
Lymphocyte %: 17.7 %
MCH: 34.7 pg — ABNORMAL HIGH (ref 26.0–34.0)
Monocyte #: 1.7 x10 3/mm — ABNORMAL HIGH (ref 0.2–0.9)
Neutrophil #: 6.8 10*3/uL — ABNORMAL HIGH (ref 1.4–6.5)
Neutrophil %: 64.2 %
RDW: 16.4 % — ABNORMAL HIGH (ref 11.5–14.5)

## 2011-11-25 LAB — PROTIME-INR: INR: 1.8

## 2011-11-26 LAB — BASIC METABOLIC PANEL
Anion Gap: 9 (ref 7–16)
Calcium, Total: 8.3 mg/dL — ABNORMAL LOW (ref 8.5–10.1)
Glucose: 78 mg/dL (ref 65–99)
Osmolality: 279 (ref 275–301)
Potassium: 3.4 mmol/L — ABNORMAL LOW (ref 3.5–5.1)

## 2011-11-26 LAB — AMMONIA: Ammonia, Plasma: 45 mcmol/L — ABNORMAL HIGH (ref 11–32)

## 2011-11-27 LAB — BASIC METABOLIC PANEL
Anion Gap: 11 (ref 7–16)
BUN: 18 mg/dL (ref 7–18)
BUN: 16 mg/dL (ref 7–18)
Chloride: 109 mmol/L — ABNORMAL HIGH (ref 98–107)
Co2: 25 mmol/L (ref 21–32)
EGFR (African American): 47 — ABNORMAL LOW
EGFR (Non-African Amer.): 44 — ABNORMAL LOW
Osmolality: 291 (ref 275–301)
Sodium: 142 mmol/L (ref 136–145)
Sodium: 145 mmol/L (ref 136–145)

## 2011-11-27 LAB — CBC WITH DIFFERENTIAL/PLATELET
Basophil #: 0 10*3/uL (ref 0.0–0.1)
Basophil %: 0.2 %
Comment - H1-Com3: NORMAL
Eosinophil %: 1.5 %
HCT: 30.2 % — ABNORMAL LOW (ref 35.0–47.0)
Lymphocyte %: 12.4 %
Lymphocytes: 14 %
MCH: 34.4 pg — ABNORMAL HIGH (ref 26.0–34.0)
MCHC: 32 g/dL (ref 32.0–36.0)
MCV: 108 fL — ABNORMAL HIGH (ref 80–100)
Monocyte #: 2.2 x10 3/mm — ABNORMAL HIGH (ref 0.2–0.9)
Monocyte %: 13 %
Monocytes: 12 %
Neutrophil #: 12.4 10*3/uL — ABNORMAL HIGH (ref 1.4–6.5)
Neutrophil %: 72.9 %
RBC: 2.81 10*6/uL — ABNORMAL LOW (ref 3.80–5.20)
RDW: 16.8 % — ABNORMAL HIGH (ref 11.5–14.5)

## 2011-11-27 LAB — PROTIME-INR: Prothrombin Time: 19.9 secs — ABNORMAL HIGH (ref 11.5–14.7)

## 2011-11-28 ENCOUNTER — Ambulatory Visit: Payer: Self-pay | Admitting: Neurology

## 2011-11-28 LAB — CBC WITH DIFFERENTIAL/PLATELET
Eosinophil %: 2 %
HGB: 9 g/dL — ABNORMAL LOW (ref 12.0–16.0)
Lymphocyte #: 1.6 10*3/uL (ref 1.0–3.6)
Lymphocyte %: 14.3 %
MCH: 36.2 pg — ABNORMAL HIGH (ref 26.0–34.0)
MCV: 108 fL — ABNORMAL HIGH (ref 80–100)
Monocyte %: 10.4 %

## 2011-11-28 LAB — BASIC METABOLIC PANEL
BUN: 17 mg/dL (ref 7–18)
Chloride: 111 mmol/L — ABNORMAL HIGH (ref 98–107)
Glucose: 114 mg/dL — ABNORMAL HIGH (ref 65–99)
Osmolality: 287 (ref 275–301)
Potassium: 3.9 mmol/L (ref 3.5–5.1)
Sodium: 143 mmol/L (ref 136–145)

## 2011-11-29 LAB — CBC WITH DIFFERENTIAL/PLATELET
Basophil %: 0.4 %
Eosinophil %: 2.3 %
HGB: 10.2 g/dL — ABNORMAL LOW (ref 12.0–16.0)
Lymphocyte #: 1.6 10*3/uL (ref 1.0–3.6)
Lymphocyte %: 16.2 %
MCHC: 32.7 g/dL (ref 32.0–36.0)
MCV: 108 fL — ABNORMAL HIGH (ref 80–100)
Monocyte #: 1.1 x10 3/mm — ABNORMAL HIGH (ref 0.2–0.9)
Monocyte %: 10.9 %
Neutrophil #: 7.1 10*3/uL — ABNORMAL HIGH (ref 1.4–6.5)
Neutrophil %: 70.2 %
Platelet: 87 10*3/uL — ABNORMAL LOW (ref 150–440)
RDW: 17.4 % — ABNORMAL HIGH (ref 11.5–14.5)

## 2011-11-29 LAB — AMMONIA: Ammonia, Plasma: 43 mcmol/L — ABNORMAL HIGH (ref 11–32)

## 2011-11-29 LAB — COMPREHENSIVE METABOLIC PANEL
Alkaline Phosphatase: 84 U/L (ref 50–136)
Anion Gap: 9 (ref 7–16)
Calcium, Total: 8.1 mg/dL — ABNORMAL LOW (ref 8.5–10.1)
Chloride: 111 mmol/L — ABNORMAL HIGH (ref 98–107)
Co2: 23 mmol/L (ref 21–32)
EGFR (African American): >60
Osmolality: 290 (ref 275–301)
Potassium: 4.6 mmol/L (ref 3.5–5.1)
SGPT (ALT): 29 U/L
Total Protein: 5.9 g/dL — ABNORMAL LOW (ref 6.4–8.2)

## 2011-11-30 LAB — BASIC METABOLIC PANEL
BUN: 26 mg/dL — ABNORMAL HIGH (ref 7–18)
Calcium, Total: 8 mg/dL — ABNORMAL LOW (ref 8.5–10.1)
Creatinine: 1.28 mg/dL (ref 0.60–1.30)
EGFR (African American): 55 — ABNORMAL LOW
EGFR (Non-African Amer.): 48 — ABNORMAL LOW
Glucose: 119 mg/dL — ABNORMAL HIGH (ref 65–99)
Potassium: 4.2 mmol/L (ref 3.5–5.1)
Sodium: 146 mmol/L — ABNORMAL HIGH (ref 136–145)

## 2011-11-30 LAB — URINALYSIS, COMPLETE
Bacteria: NONE SEEN
Bilirubin,UR: NEGATIVE
Glucose,UR: NEGATIVE mg/dL (ref 0–75)
Leukocyte Esterase: NEGATIVE
Nitrite: NEGATIVE
Ph: 6 (ref 4.5–8.0)
Protein: NEGATIVE
RBC,UR: 6 /HPF (ref 0–5)
Specific Gravity: 1.025 (ref 1.003–1.030)

## 2011-11-30 LAB — PROTIME-INR: INR: 1.7

## 2011-11-30 LAB — TSH: Thyroid Stimulating Horm: 0.357 u[IU]/mL — ABNORMAL LOW

## 2011-11-30 LAB — APTT: Activated PTT: 33.7 secs (ref 23.6–35.9)

## 2011-11-30 LAB — AMMONIA: Ammonia, Plasma: 40 mcmol/L — ABNORMAL HIGH (ref 11–32)

## 2011-11-30 LAB — URINE CULTURE

## 2011-12-02 LAB — CBC WITH DIFFERENTIAL/PLATELET
Basophil: 1 %
Comment - H1-Com3: NORMAL
Eosinophil: 3 %
Lymphocytes: 13 %
MCHC: 32.6 g/dL (ref 32.0–36.0)
MCV: 111 fL — ABNORMAL HIGH (ref 80–100)
Metamyelocyte: 1 %
Monocytes: 11 %
Platelet: 108 10*3/uL — ABNORMAL LOW (ref 150–440)
RDW: 19.8 % — ABNORMAL HIGH (ref 11.5–14.5)
Segmented Neutrophils: 67 %

## 2011-12-02 LAB — COMPREHENSIVE METABOLIC PANEL
Alkaline Phosphatase: 86 U/L (ref 50–136)
Anion Gap: 6 — ABNORMAL LOW (ref 7–16)
BUN: 22 mg/dL — ABNORMAL HIGH (ref 7–18)
Bilirubin,Total: 1.9 mg/dL — ABNORMAL HIGH (ref 0.2–1.0)
Chloride: 119 mmol/L — ABNORMAL HIGH (ref 98–107)
Creatinine: 1.25 mg/dL (ref 0.60–1.30)
EGFR (African American): 57 — ABNORMAL LOW
Osmolality: 300 (ref 275–301)
Potassium: 3.9 mmol/L (ref 3.5–5.1)
SGPT (ALT): 30 U/L
Sodium: 149 mmol/L — ABNORMAL HIGH (ref 136–145)

## 2011-12-02 LAB — PHOSPHORUS: Phosphorus: 2.8 mg/dL (ref 2.5–4.9)

## 2011-12-02 LAB — MAGNESIUM: Magnesium: 2.1 mg/dL

## 2011-12-03 LAB — URINALYSIS, COMPLETE
Bacteria: NONE SEEN
Bilirubin,UR: NEGATIVE
Glucose,UR: NEGATIVE mg/dL (ref 0–75)
Leukocyte Esterase: NEGATIVE
Nitrite: NEGATIVE
Ph: 6 (ref 4.5–8.0)
Protein: NEGATIVE
RBC,UR: 32 /HPF (ref 0–5)
Specific Gravity: 1.02 (ref 1.003–1.030)
Squamous Epithelial: <1
WBC UR: 9 /HPF (ref 0–5)

## 2011-12-03 LAB — BASIC METABOLIC PANEL
Anion Gap: 7 (ref 7–16)
BUN: 19 mg/dL — ABNORMAL HIGH (ref 7–18)
Calcium, Total: 7.8 mg/dL — ABNORMAL LOW (ref 8.5–10.1)
Chloride: 121 mmol/L — ABNORMAL HIGH (ref 98–107)
Co2: 24 mmol/L (ref 21–32)
EGFR (Non-African Amer.): 53 — ABNORMAL LOW
Glucose: 123 mg/dL — ABNORMAL HIGH (ref 65–99)
Osmolality: 305 (ref 275–301)
Potassium: 3.7 mmol/L (ref 3.5–5.1)

## 2011-12-03 LAB — CBC WITH DIFFERENTIAL/PLATELET
Basophil #: 0 10*3/uL (ref 0.0–0.1)
Basophil %: 0.3 %
Eosinophil %: 2.4 %
Lymphocyte #: 2.4 10*3/uL (ref 1.0–3.6)
MCH: 36.6 pg — ABNORMAL HIGH (ref 26.0–34.0)
MCV: 112 fL — ABNORMAL HIGH (ref 80–100)
Monocyte #: 2.9 x10 3/mm — ABNORMAL HIGH (ref 0.2–0.9)
Monocyte %: 18.1 %
Neutrophil #: 10.2 10*3/uL — ABNORMAL HIGH (ref 1.4–6.5)
Neutrophil %: 64.3 %
Platelet: 91 10*3/uL — ABNORMAL LOW (ref 150–440)
RBC: 2.58 10*6/uL — ABNORMAL LOW (ref 3.80–5.20)
WBC: 15.9 10*3/uL — ABNORMAL HIGH (ref 3.6–11.0)

## 2011-12-03 LAB — CLOSTRIDIUM DIFFICILE BY PCR

## 2011-12-04 LAB — CBC WITH DIFFERENTIAL/PLATELET
Eosinophil #: 0.2 10*3/uL (ref 0.0–0.7)
HGB: 9.9 g/dL — ABNORMAL LOW (ref 12.0–16.0)
Lymphocyte #: 2.2 10*3/uL (ref 1.0–3.6)
Lymphocyte %: 13.3 %
MCH: 36.9 pg — ABNORMAL HIGH (ref 26.0–34.0)
MCHC: 33.1 g/dL (ref 32.0–36.0)
MCV: 112 fL — ABNORMAL HIGH (ref 80–100)
Monocyte #: 2.4 x10 3/mm — ABNORMAL HIGH (ref 0.2–0.9)
Monocyte %: 14.7 %
Neutrophil %: 70.6 %
RBC: 2.67 10*6/uL — ABNORMAL LOW (ref 3.80–5.20)
RDW: 20.1 % — ABNORMAL HIGH (ref 11.5–14.5)

## 2011-12-04 LAB — BASIC METABOLIC PANEL
Anion Gap: 8 (ref 7–16)
BUN: 20 mg/dL — ABNORMAL HIGH (ref 7–18)
Osmolality: 300 (ref 275–301)
Sodium: 149 mmol/L — ABNORMAL HIGH (ref 136–145)

## 2011-12-04 LAB — URINE CULTURE

## 2011-12-05 LAB — CBC WITH DIFFERENTIAL/PLATELET
Basophil #: 0 10*3/uL (ref 0.0–0.1)
Eosinophil %: 0.8 %
Lymphocyte #: 1.4 10*3/uL (ref 1.0–3.6)
Lymphocyte %: 9.3 %
MCV: 112 fL — ABNORMAL HIGH (ref 80–100)
Monocyte #: 1.9 x10 3/mm — ABNORMAL HIGH (ref 0.2–0.9)
Monocyte %: 12.2 %
Neutrophil #: 12 10*3/uL — ABNORMAL HIGH (ref 1.4–6.5)
RDW: 19.9 % — ABNORMAL HIGH (ref 11.5–14.5)

## 2011-12-05 LAB — BASIC METABOLIC PANEL
BUN: 22 mg/dL — ABNORMAL HIGH (ref 7–18)
Calcium, Total: 7.7 mg/dL — ABNORMAL LOW (ref 8.5–10.1)
EGFR (African American): >60
EGFR (Non-African Amer.): >60
Potassium: 3.6 mmol/L (ref 3.5–5.1)

## 2011-12-07 LAB — PROTIME-INR: Prothrombin Time: 17 secs — ABNORMAL HIGH (ref 11.5–14.7)

## 2011-12-07 LAB — CBC WITH DIFFERENTIAL/PLATELET
Basophil #: 0.1 10*3/uL (ref 0.0–0.1)
Eosinophil #: 0.3 10*3/uL (ref 0.0–0.7)
Eosinophil %: 2.1 %
HGB: 10.3 g/dL — ABNORMAL LOW (ref 12.0–16.0)
Lymphocyte %: 14.4 %
MCH: 37.3 pg — ABNORMAL HIGH (ref 26.0–34.0)
MCV: 114 fL — ABNORMAL HIGH (ref 80–100)
Monocyte %: 12.7 %
Neutrophil #: 9.1 10*3/uL — ABNORMAL HIGH (ref 1.4–6.5)
Neutrophil %: 70.3 %
Platelet: 124 10*3/uL — ABNORMAL LOW (ref 150–440)
RDW: 22.2 % — ABNORMAL HIGH (ref 11.5–14.5)

## 2011-12-07 LAB — BASIC METABOLIC PANEL
Anion Gap: 10 (ref 7–16)
Co2: 21 mmol/L (ref 21–32)
Creatinine: 1.12 mg/dL (ref 0.60–1.30)
EGFR (African American): >60
EGFR (Non-African Amer.): 56 — ABNORMAL LOW
Glucose: 143 mg/dL — ABNORMAL HIGH (ref 65–99)
Osmolality: 293 (ref 275–301)
Sodium: 144 mmol/L (ref 136–145)

## 2011-12-07 LAB — AMMONIA: Ammonia, Plasma: 59 mcmol/L — ABNORMAL HIGH (ref 11–32)

## 2011-12-08 ENCOUNTER — Ambulatory Visit: Payer: Self-pay | Admitting: Internal Medicine

## 2011-12-08 LAB — BASIC METABOLIC PANEL
Anion Gap: 8 (ref 7–16)
BUN: 22 mg/dL — ABNORMAL HIGH (ref 7–18)
Chloride: 109 mmol/L — ABNORMAL HIGH (ref 98–107)
Co2: 26 mmol/L (ref 21–32)
Creatinine: 1.15 mg/dL (ref 0.60–1.30)
EGFR (Non-African Amer.): 54 — ABNORMAL LOW
Glucose: 129 mg/dL — ABNORMAL HIGH (ref 65–99)
Osmolality: 290 (ref 275–301)

## 2011-12-08 LAB — CBC WITH DIFFERENTIAL/PLATELET
Basophil %: 0.3 %
Eosinophil #: 0.5 10*3/uL (ref 0.0–0.7)
Eosinophil %: 3.9 %
HGB: 9 g/dL — ABNORMAL LOW (ref 12.0–16.0)
Lymphocyte #: 1.8 10*3/uL (ref 1.0–3.6)
MCHC: 32.9 g/dL (ref 32.0–36.0)
MCV: 114 fL — ABNORMAL HIGH (ref 80–100)
Monocyte #: 1.6 x10 3/mm — ABNORMAL HIGH (ref 0.2–0.9)
Monocyte %: 13.4 %
RBC: 2.38 10*6/uL — ABNORMAL LOW (ref 3.80–5.20)
RDW: 22 % — ABNORMAL HIGH (ref 11.5–14.5)
WBC: 11.9 10*3/uL — ABNORMAL HIGH (ref 3.6–11.0)

## 2011-12-08 LAB — CULTURE, BLOOD (SINGLE)

## 2011-12-10 LAB — CBC WITH DIFFERENTIAL/PLATELET
Basophil #: 0.1 10*3/uL (ref 0.0–0.1)
Basophil %: 1.1 %
Eosinophil %: 3.8 %
HCT: 26.1 % — ABNORMAL LOW (ref 35.0–47.0)
HGB: 8.9 g/dL — ABNORMAL LOW (ref 12.0–16.0)
Lymphocyte %: 18.6 %
MCH: 38.1 pg — ABNORMAL HIGH (ref 26.0–34.0)
MCHC: 34 g/dL (ref 32.0–36.0)
MCV: 112 fL — ABNORMAL HIGH (ref 80–100)
Neutrophil #: 5.6 10*3/uL (ref 1.4–6.5)
Platelet: 123 10*3/uL — ABNORMAL LOW (ref 150–440)
RBC: 2.33 10*6/uL — ABNORMAL LOW (ref 3.80–5.20)
RDW: 22.1 % — ABNORMAL HIGH (ref 11.5–14.5)

## 2011-12-10 LAB — BASIC METABOLIC PANEL
Calcium, Total: 8 mg/dL — ABNORMAL LOW (ref 8.5–10.1)
Chloride: 106 mmol/L (ref 98–107)
Co2: 26 mmol/L (ref 21–32)
Creatinine: 1.13 mg/dL (ref 0.60–1.30)
EGFR (African American): >60
Glucose: 100 mg/dL — ABNORMAL HIGH (ref 65–99)
Osmolality: 275 (ref 275–301)
Sodium: 137 mmol/L (ref 136–145)

## 2011-12-14 ENCOUNTER — Inpatient Hospital Stay: Payer: Self-pay | Admitting: Internal Medicine

## 2011-12-14 LAB — CBC WITH DIFFERENTIAL/PLATELET
Basophil #: 0.1 10*3/uL (ref 0.0–0.1)
Eosinophil %: 1.6 %
HCT: 23.4 % — ABNORMAL LOW (ref 35.0–47.0)
HGB: 7.8 g/dL — ABNORMAL LOW (ref 12.0–16.0)
Lymphocyte %: 18.5 %
MCH: 37.6 pg — ABNORMAL HIGH (ref 26.0–34.0)
MCHC: 33.2 g/dL (ref 32.0–36.0)
Monocyte #: 1.5 x10 3/mm — ABNORMAL HIGH (ref 0.2–0.9)
Neutrophil #: 7.7 10*3/uL — ABNORMAL HIGH (ref 1.4–6.5)
Neutrophil %: 66.2 %
RBC: 2.07 10*6/uL — ABNORMAL LOW (ref 3.80–5.20)
RDW: 20.5 % — ABNORMAL HIGH (ref 11.5–14.5)

## 2011-12-14 LAB — URINALYSIS, COMPLETE
Bilirubin,UR: NEGATIVE
Blood: NEGATIVE
Glucose,UR: NEGATIVE mg/dL (ref 0–75)
Nitrite: NEGATIVE
Ph: 5 (ref 4.5–8.0)
RBC,UR: 6 /HPF (ref 0–5)
Specific Gravity: 1.018 (ref 1.003–1.030)
Squamous Epithelial: 5

## 2011-12-14 LAB — CBC
HCT: 26.5 % — ABNORMAL LOW (ref 35.0–47.0)
HGB: 8.9 g/dL — ABNORMAL LOW (ref 12.0–16.0)
MCHC: 33.7 g/dL (ref 32.0–36.0)
Platelet: 181 10*3/uL (ref 150–440)
RBC: 2.34 10*6/uL — ABNORMAL LOW (ref 3.80–5.20)
WBC: 11.6 10*3/uL — ABNORMAL HIGH (ref 3.6–11.0)

## 2011-12-14 LAB — COMPREHENSIVE METABOLIC PANEL
Albumin: 2.3 g/dL — ABNORMAL LOW (ref 3.4–5.0)
Anion Gap: 9 (ref 7–16)
BUN: 25 mg/dL — ABNORMAL HIGH (ref 7–18)
Calcium, Total: 8.2 mg/dL — ABNORMAL LOW (ref 8.5–10.1)
Chloride: 106 mmol/L (ref 98–107)
Creatinine: 1.36 mg/dL — ABNORMAL HIGH (ref 0.60–1.30)
EGFR (African American): 51 — ABNORMAL LOW
Potassium: 4 mmol/L (ref 3.5–5.1)
SGOT(AST): 56 U/L — ABNORMAL HIGH (ref 15–37)
SGPT (ALT): 41 U/L

## 2011-12-14 LAB — PROTIME-INR
INR: 1.5
Prothrombin Time: 18.3 secs — ABNORMAL HIGH (ref 11.5–14.7)

## 2011-12-14 LAB — LIPASE, BLOOD: Lipase: 479 U/L — ABNORMAL HIGH (ref 73–393)

## 2011-12-14 LAB — OCCULT BLOOD X 1 CARD TO LAB, STOOL: Occult Blood, Feces: POSITIVE

## 2011-12-15 LAB — BASIC METABOLIC PANEL
Anion Gap: 7 (ref 7–16)
Calcium, Total: 8.1 mg/dL — ABNORMAL LOW (ref 8.5–10.1)
Chloride: 107 mmol/L (ref 98–107)
Creatinine: 1.57 mg/dL — ABNORMAL HIGH (ref 0.60–1.30)
EGFR (African American): 43 — ABNORMAL LOW
EGFR (Non-African Amer.): 37 — ABNORMAL LOW
Glucose: 90 mg/dL (ref 65–99)
Osmolality: 284 (ref 275–301)
Potassium: 4.4 mmol/L (ref 3.5–5.1)
Sodium: 138 mmol/L (ref 136–145)

## 2011-12-15 LAB — PROTIME-INR
INR: 1.5
Prothrombin Time: 18.6 secs — ABNORMAL HIGH (ref 11.5–14.7)

## 2011-12-15 LAB — CBC WITH DIFFERENTIAL/PLATELET
Basophil #: 0.1 10*3/uL (ref 0.0–0.1)
Basophil %: 0.5 %
Eosinophil #: 0.2 10*3/uL (ref 0.0–0.7)
Eosinophil %: 1.5 %
HGB: 9.8 g/dL — ABNORMAL LOW (ref 12.0–16.0)
Lymphocyte #: 2.9 10*3/uL (ref 1.0–3.6)
Lymphocyte %: 21.9 %
MCHC: 34.2 g/dL (ref 32.0–36.0)
Monocyte #: 1.9 x10 3/mm — ABNORMAL HIGH (ref 0.2–0.9)
Monocyte %: 14.4 %
Neutrophil %: 61.7 %
Platelet: 170 10*3/uL (ref 150–440)
RDW: 21.9 % — ABNORMAL HIGH (ref 11.5–14.5)
WBC: 13.1 10*3/uL — ABNORMAL HIGH (ref 3.6–11.0)

## 2011-12-15 LAB — AMMONIA: Ammonia, Plasma: <25 mcmol/L (ref 11–32)

## 2011-12-16 LAB — HEMOGLOBIN: HGB: 8.6 g/dL — ABNORMAL LOW (ref 12.0–16.0)

## 2011-12-16 LAB — BASIC METABOLIC PANEL
Chloride: 108 mmol/L — ABNORMAL HIGH (ref 98–107)
Co2: 23 mmol/L (ref 21–32)
Creatinine: 1.51 mg/dL — ABNORMAL HIGH (ref 0.60–1.30)
EGFR (Non-African Amer.): 39 — ABNORMAL LOW
Glucose: 102 mg/dL — ABNORMAL HIGH (ref 65–99)
Sodium: 139 mmol/L (ref 136–145)

## 2011-12-17 LAB — CBC WITH DIFFERENTIAL/PLATELET
Basophil %: 0.1 %
Eosinophil #: 0.1 10*3/uL (ref 0.0–0.7)
Eosinophil %: 0.6 %
HGB: 9.5 g/dL — ABNORMAL LOW (ref 12.0–16.0)
Lymphocyte #: 2.1 10*3/uL (ref 1.0–3.6)
Lymphocyte %: 17.4 %
MCH: 37.6 pg — ABNORMAL HIGH (ref 26.0–34.0)
MCHC: 33.9 g/dL (ref 32.0–36.0)
Monocyte %: 8.5 %
Neutrophil %: 73.4 %
Platelet: 182 10*3/uL (ref 150–440)
RDW: 21.7 % — ABNORMAL HIGH (ref 11.5–14.5)

## 2011-12-17 LAB — BASIC METABOLIC PANEL
Anion Gap: 11 (ref 7–16)
Calcium, Total: 8.2 mg/dL — ABNORMAL LOW (ref 8.5–10.1)
Co2: 22 mmol/L (ref 21–32)
EGFR (African American): 40 — ABNORMAL LOW
Glucose: 69 mg/dL (ref 65–99)
Potassium: 3.8 mmol/L (ref 3.5–5.1)

## 2011-12-17 LAB — HEPATIC FUNCTION PANEL A (ARMC)
Albumin: 2.4 g/dL — ABNORMAL LOW (ref 3.4–5.0)
Alkaline Phosphatase: 148 U/L — ABNORMAL HIGH (ref 50–136)
Bilirubin, Direct: 1 mg/dL — ABNORMAL HIGH (ref 0.00–0.20)
Total Protein: 6.5 g/dL (ref 6.4–8.2)

## 2011-12-17 LAB — PROTIME-INR: INR: 1.3

## 2012-01-08 ENCOUNTER — Ambulatory Visit: Payer: Self-pay | Admitting: Internal Medicine

## 2012-01-08 DEATH — deceased

## 2013-03-09 IMAGING — CR DG CHEST 1V
1 series · 1 of 1 positions shown · non-contrast
Comparison: none

REASON FOR EXAM: right rib pain and dyspnea sp fall
COMMENTS:

PROCEDURE:     DXR - DXR CHEST 1 VIEWAP OR PA  - November 24, 2011  [DATE]
RESULT:     There is minimal atelectasis at the lung bases. No pneumonia,
pneumothorax or pleural effusion is seen. Heart size is normal.

[t chest supine]
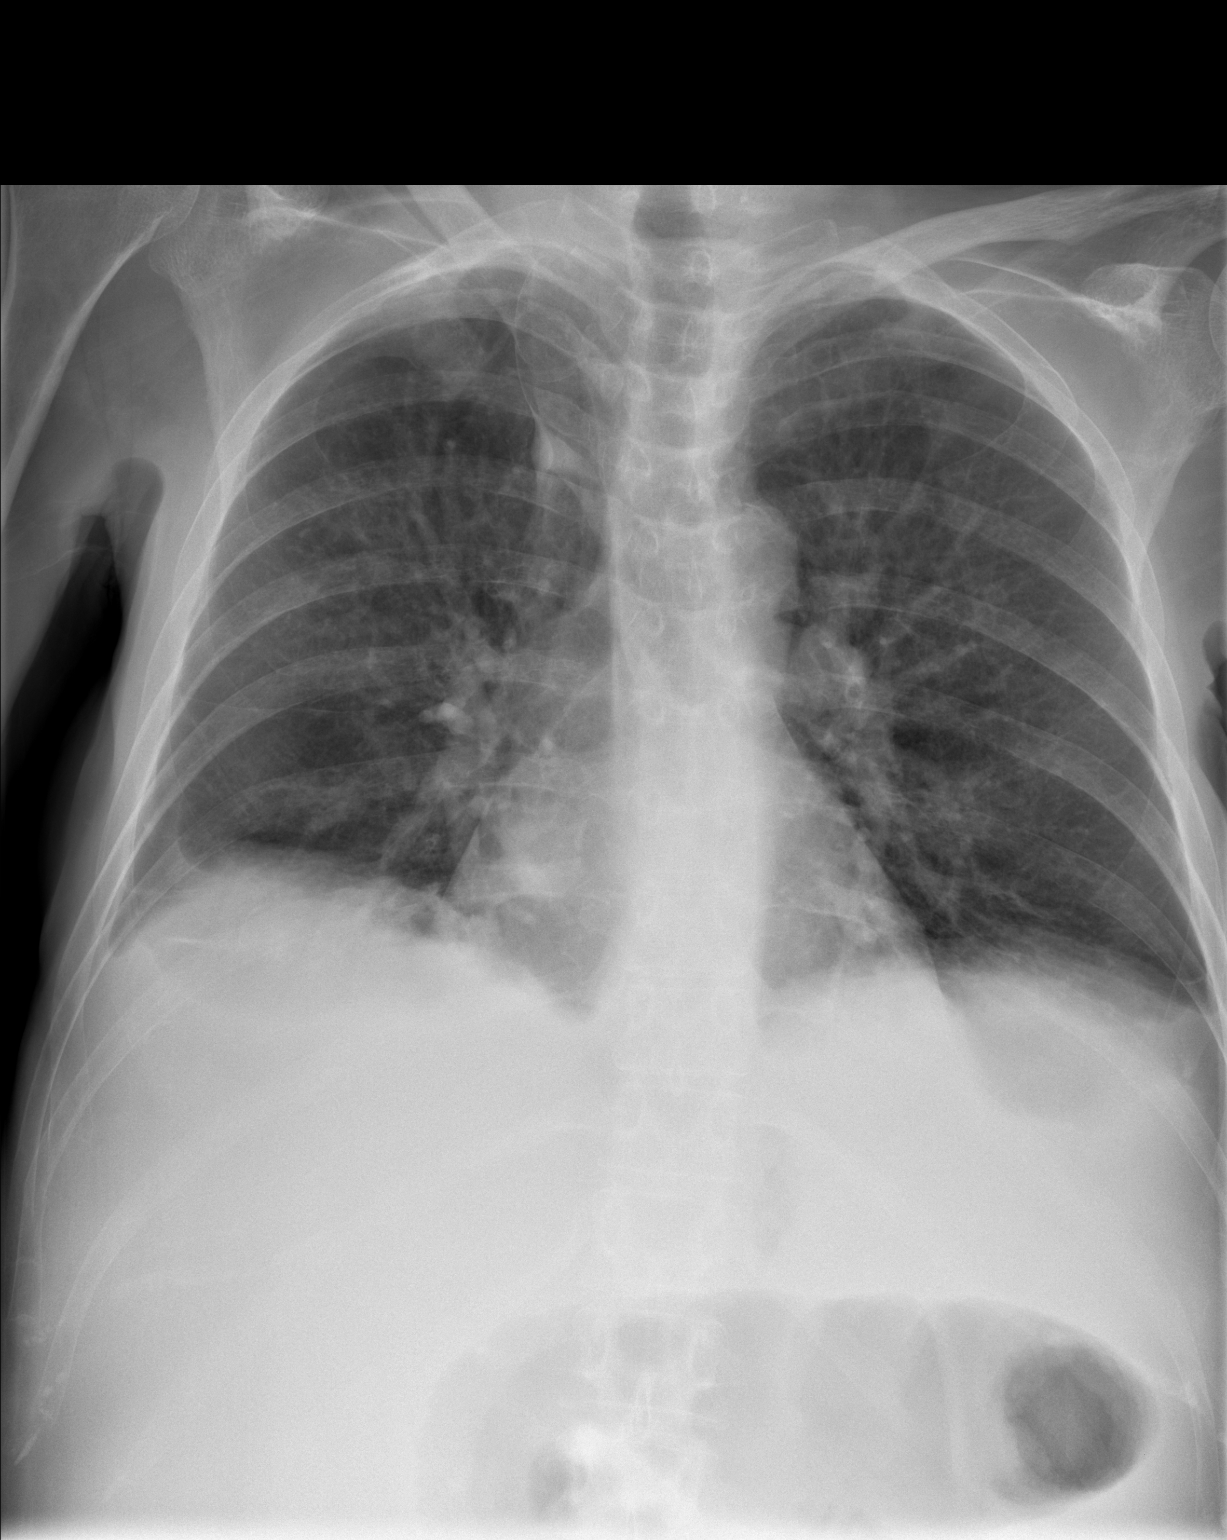

[1 of 1 positions shown; findings below may reference images not displayed]

IMPRESSION: 1. No acute changes are identified.
2. There is slight atelectasis at both lung bases.

## 2013-03-12 IMAGING — CR DG CHEST 1V PORT
1 series · 1 of 1 positions shown · non-contrast
Comparison: none

REASON FOR EXAM: hypoxia and elevated WBC count
COMMENTS:

[portable]
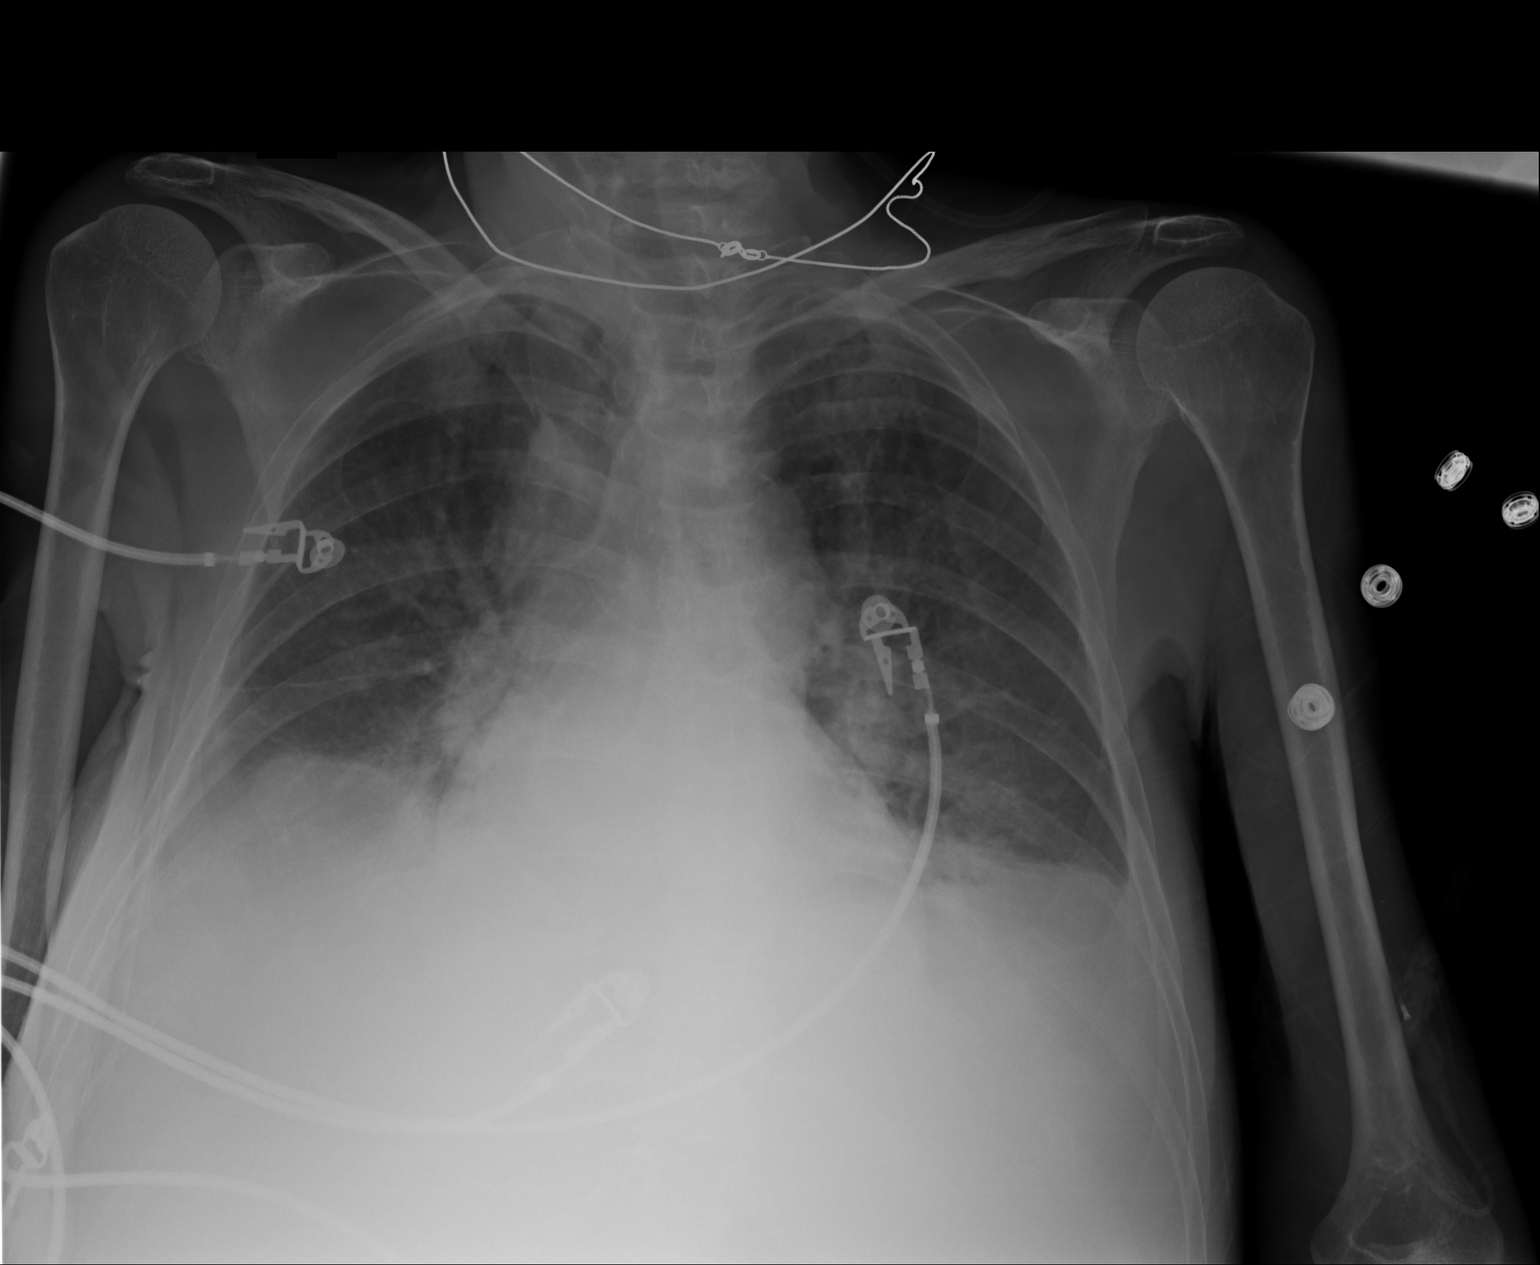

[1 of 1 positions shown; findings below may reference images not displayed]

PROCEDURE:     DXR - DXR PORTABLE CHEST SINGLE VIEW  - November 27, 2011  [DATE]

RESULT:     Correspond reactor. Mild bilateral pulmonary infiltrates are
noted. Mild cardiomegaly and pulmonary venous congestion. Findings suggest
mild congestive heart failureversus mild pneumonia .Findings new from
11/24/2011.
IMPRESSION: Mild congestive heart failure versus bilateral pneumonia .

## 2013-03-15 IMAGING — CR DG ABDOMEN 1V
1 series · 1 of 1 positions shown · non-contrast
Comparison: none

REASON FOR EXAM: dobhoff placment
COMMENTS:   Bedside (portable):Y

[ap]
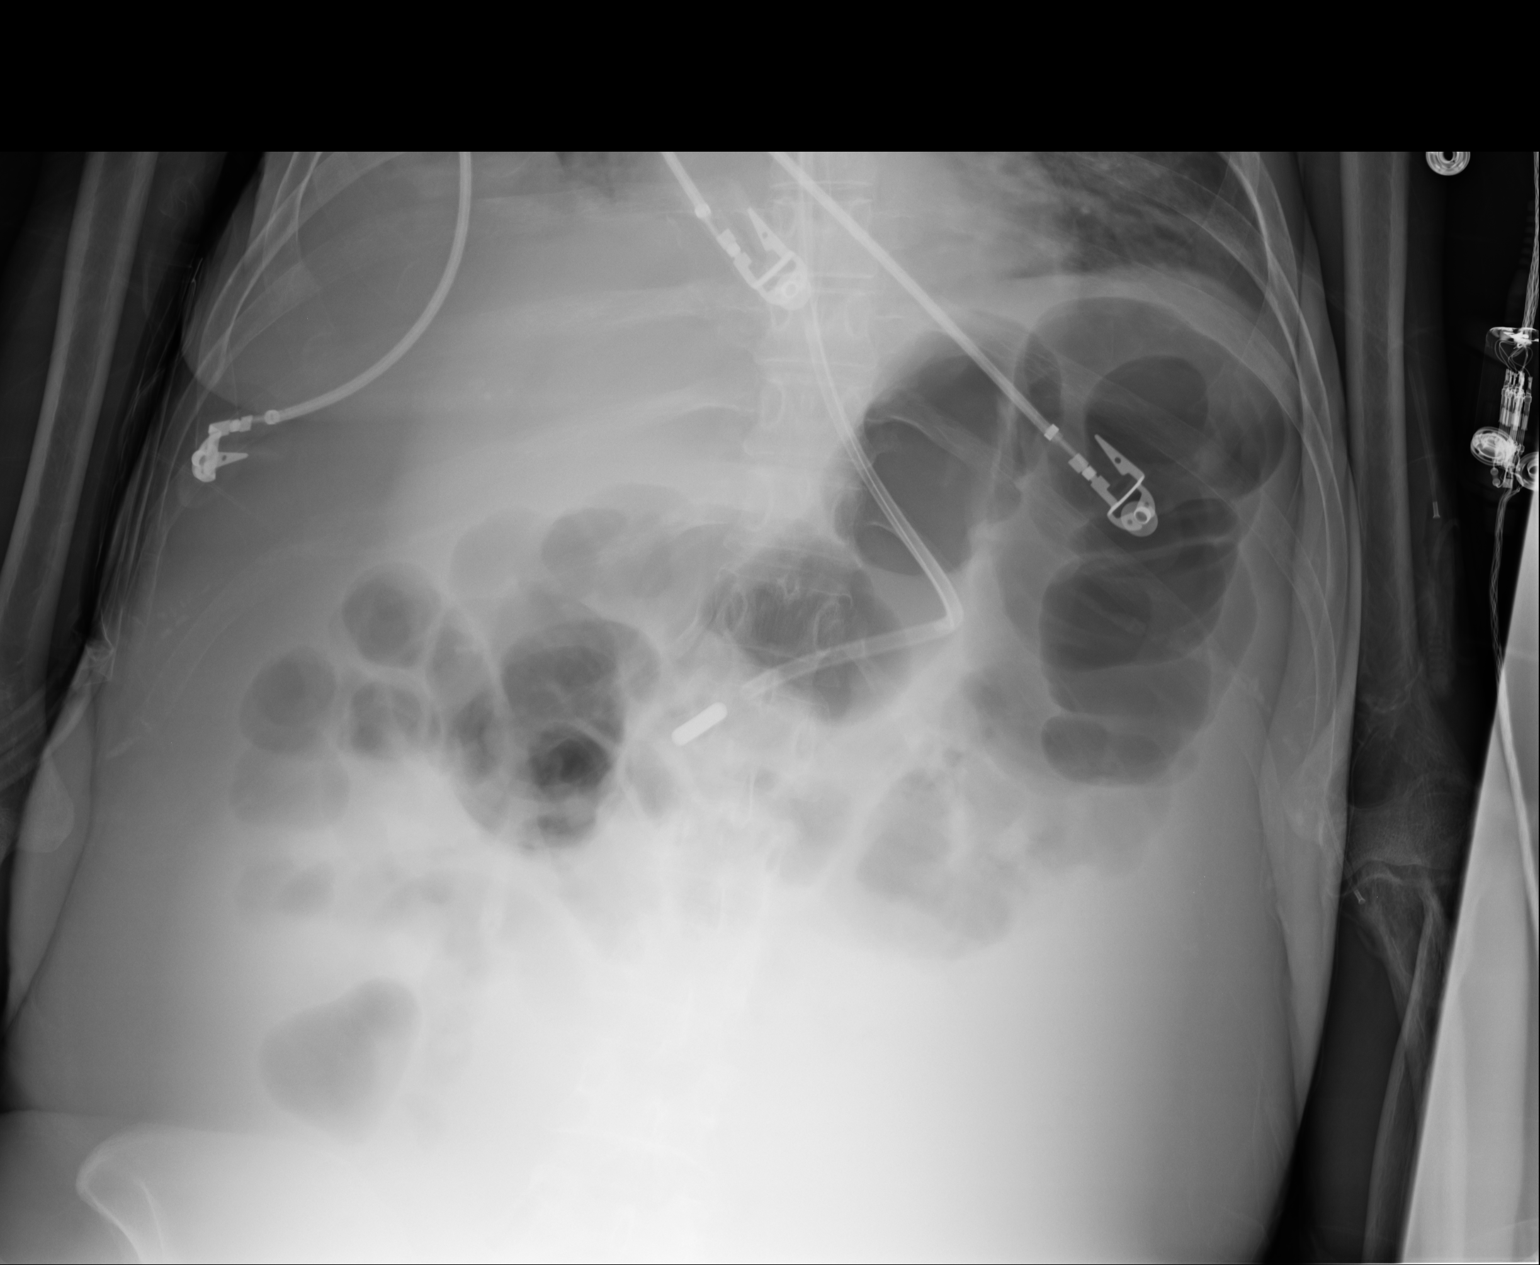

[1 of 1 positions shown; findings below may reference images not displayed]

PROCEDURE:     DXR - DXR ABDOMEN AP ONLY  - November 30, 2011  [DATE]

RESULT:     Dobbhoff tube projects to the right of midline in the area the
gastric antrum or caloric and from. No downward curve is seen to suggest
this is passed through the course into the small bowel. Right lung base
infiltrate or atelectasis is present. The bowel gas pattern is otherwise
unremarkable. Cardiac monitoring electrodes are present.
IMPRESSION: 1. Dobbhoff tube appears to be and prepyloric region. No excess tube is
evident within the stomach.

[REDACTED]

## 2013-03-16 IMAGING — CR DG CHEST 1V PORT
1 series · 1 of 1 positions shown · non-contrast
Comparison: none

REASON FOR EXAM: central line placment
COMMENTS:

[portable]
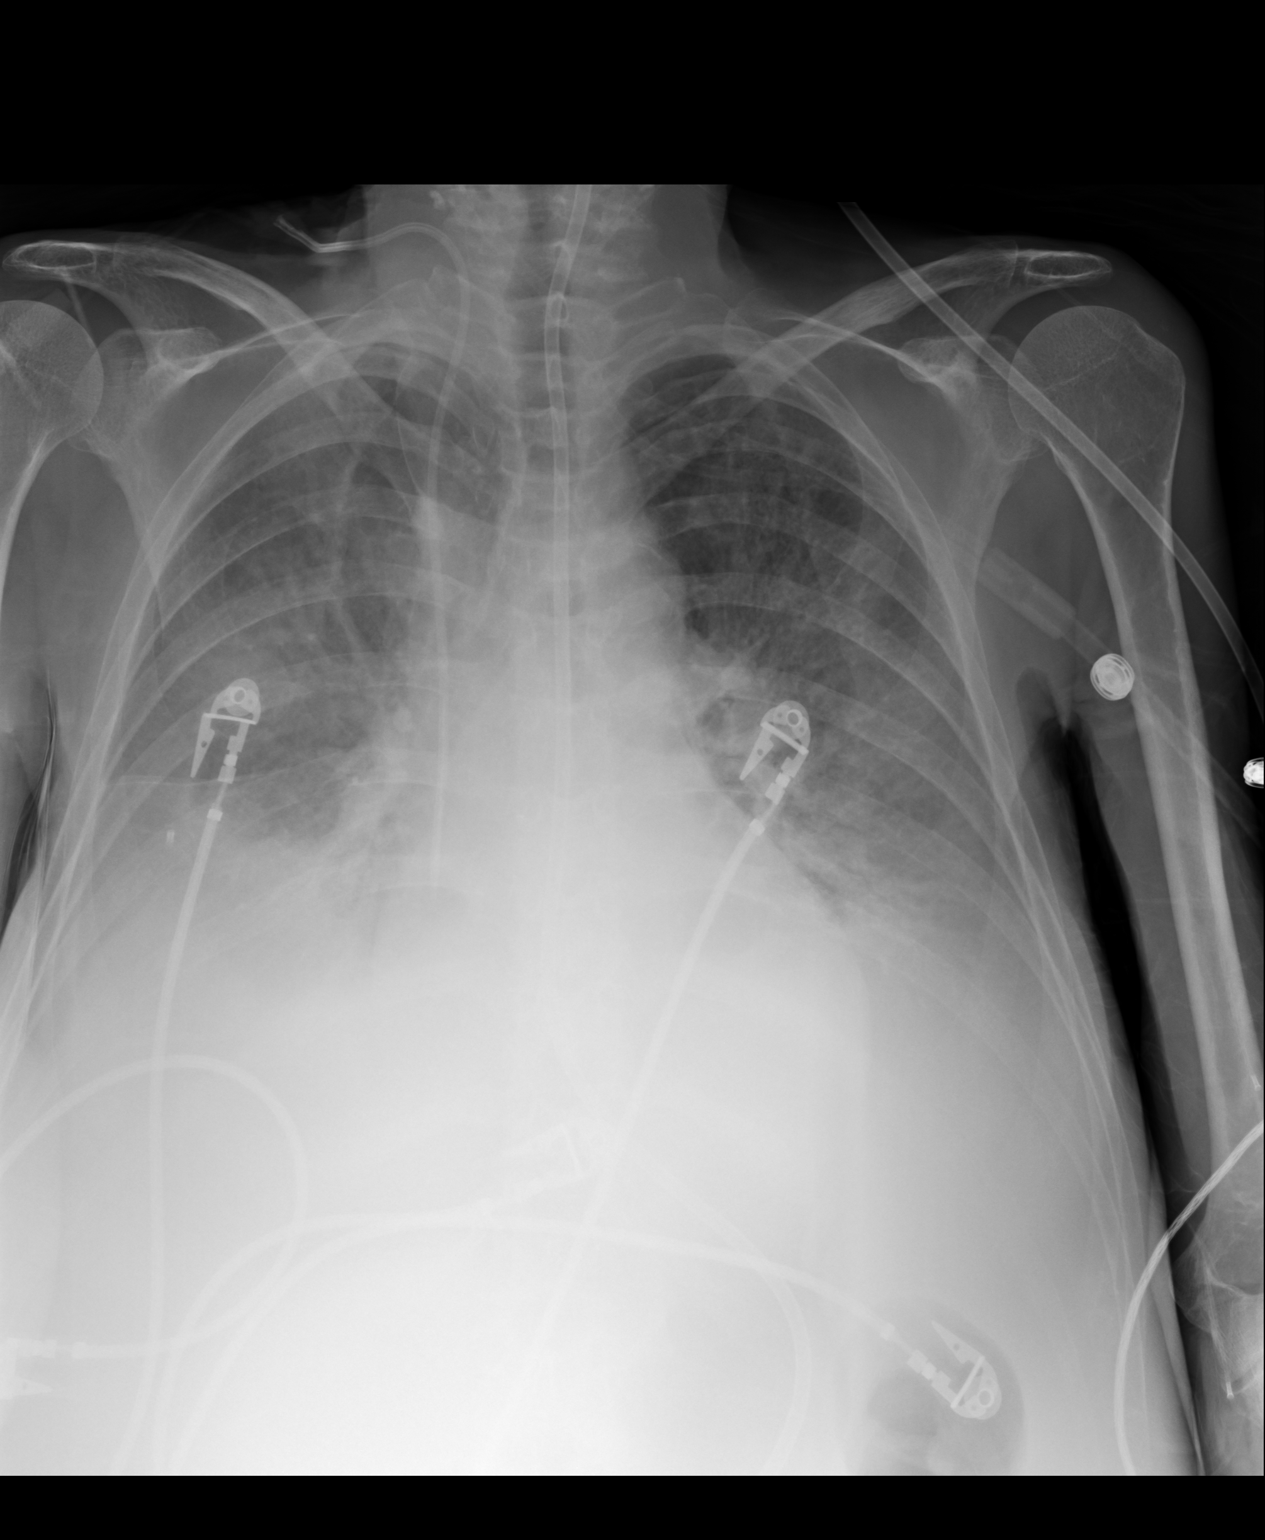

[1 of 1 positions shown; findings below may reference images not displayed]

PROCEDURE:     DXR - DXR PORTABLE CHEST SINGLE VIEW  - December 01, 2011  [DATE]

RESULT:

Comparison is made to the previous chest x-ray dated 27 November, 2011.

A right internal jugular central venous catheter is present. The tip is in
the right atrium and may pass into the inferior vena cava. A gastric tube
passes into the stomach. The distal tip is not seen. Bilateral lung base
infiltrate versus atelectasis is present. There is mild interstitial edema.
Small, bilateral pleural effusions are present. The cardiac silhouette
appears to be borderline enlarged. Monitoring electrodes are present. The
bony structures appear unremarkable.
IMPRESSION: 1.  Right internal jugular central venous catheter is present with the tip
in the right atrium.
2.  Mild edema with basilar atelectasis versus infiltrate and small,
bilateral effusions.

[REDACTED]

## 2013-03-22 IMAGING — US US GUIDE NEEDLE - US PARA
1 series · 6 of 6 positions shown · non-contrast
Comparison: none

REASON FOR EXAM: ascited/abd distention, pt will get vit k today if inr
still elevated prior to p
COMMENTS:

PROCEDURE:     US  - US GUIDED PARACENTESIS  - December 07, 2011 [DATE]
RESULT:     Ultrasound Guided Paracentesis
INDICATION: Patient with end-stage liver disease presents with ascites
refractory to medical management.  Paracentesis is requested.

[Series 1: us guide needle - us para · 6 of 6 slices shown]
[im 1/6]
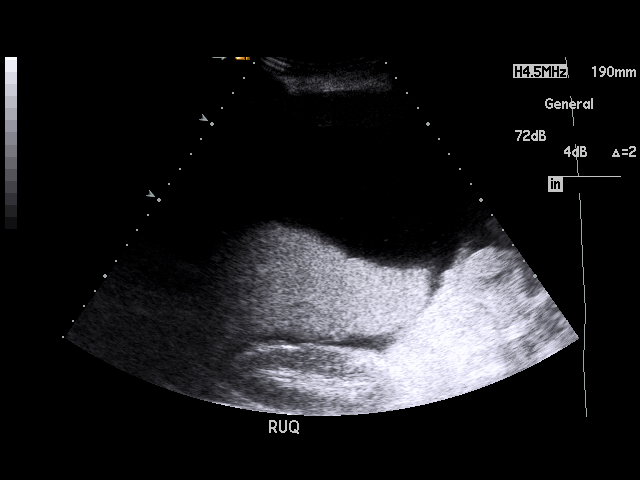
[im 2/6]
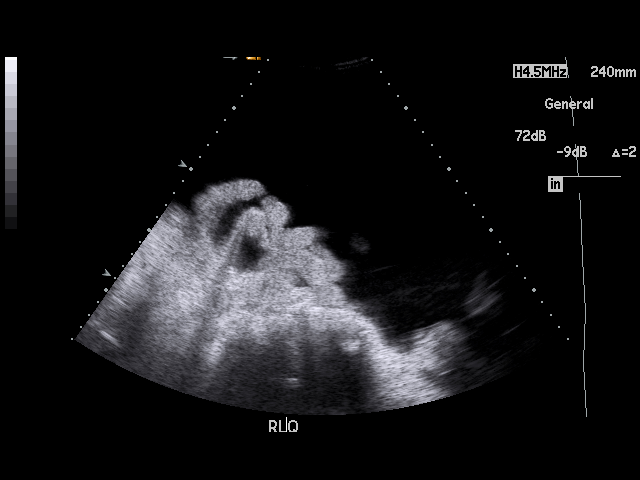
[im 3/6]
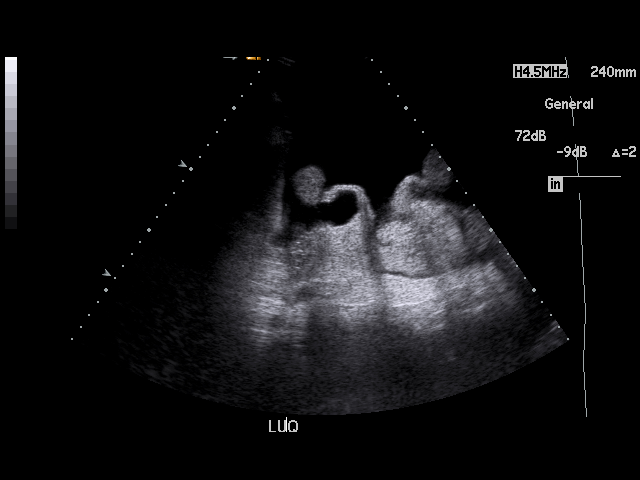
[im 4/6]
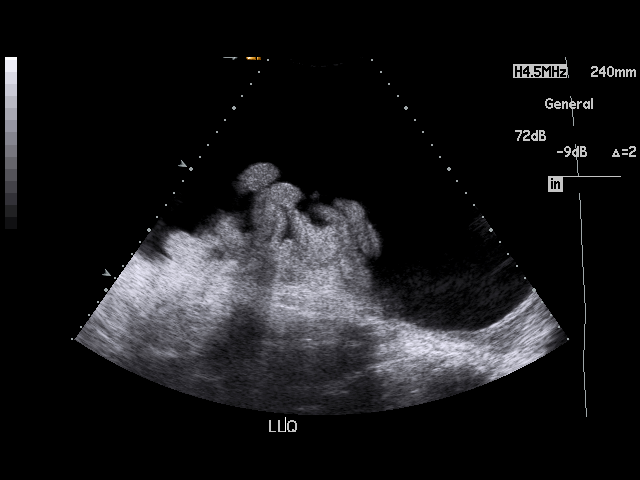
[im 5/6]
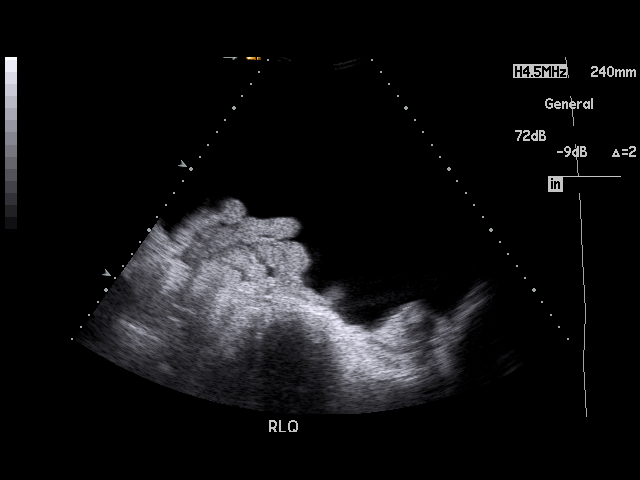
[im 6/6]
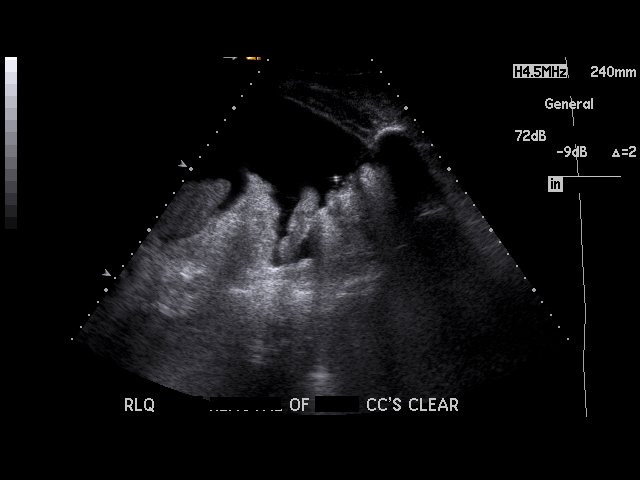

[6 of 6 positions shown; findings below may reference images not displayed]

Comparisons: None

Procedure:

Clinical assessment was performed and informed consent obtained. The patient
was brought to the ultrasound suite and placed in the supine position.
Focused abdominal ultrasound demonstrated a moderate amount of ascites. The
most accessible pocket was identified in the right lower quadrant and marked
for paracentesis.

A formal timeout procedure was performed according to departmental protocol.
The abdomen was prepped and draped in the usual sterile manner. The
overlying skin was anesthetized with 5 ml of 1% lidocaine. The peritoneal
cavity was accessed using a 6 French Safe-T centesis needle catheter system
and connected to a Vacutainer.

4333 ml of straw-colored ascitic fluid was removed.  The drainage catheter
was removed and a sterile dressing placed at the puncture site. The
procedure was well tolerated and without complication. Hemostasis was
achieved.
IMPRESSION: Uncomplicated ultrasound guided paracentesis.

## 2014-12-01 NOTE — Consult Note (Signed)
PATIENT NAME:  Alyssa Soto, Alyssa Soto MR#:  [id number] DATE OF BIRTH:  1956/04/24  DATE OF CONSULTATION:  12/14/2011  REFERRING PHYSICIAN:   CONSULTING PHYSICIAN:  Scot Jun, MD  HISTORY OF PRESENT ILLNESS: The patient is a 59 year old white female with a history of alcohol cirrhosis and hepatitis C. She was in the hospital previously with some GI bleeding and altered mental status consistent with alcohol withdrawal, although the patient denied recent drinking. She was discharged to hospice and had vomiting of blood and was sent back to the ER.  A NG tube was inserted showing blood. She had a very distended abdomen with ascites, and I was asked to see her in consultation having followed her in the hospital previously.   The patient is confused and is not a reliable historian. She currently denies chest pain. She has increasing abdominal distention. She denies shortness of breath. She did vomit blood. She denies any dysuria or hematuria. Previously the patient had a spell of altered mental status for a few days that was consistent with alcohol withdrawal as it occurred about three days after her admission. She had no further bleeding during the hospitalization of several days.  She did have a paracentesis of 6000 mL which resulted in some brief improvement in her abdominal distention.   She was on a low sodium soft diet and was scheduled to follow-up with me in 2 to 3 weeks. During her previous hospitalization, she had a CT of the head for altered mental status that showed no acute changes. She was treated with octreotide drip and Protonix drip. A central line was inserted for use. Previous chest x-ray on 12/01/2011 showed mild edema with bibasilar atelectasis versus infiltrate. Stool for C. difficile was negative. Ultrasound of the abdomen showed cholelithiasis without acute cholecystitis. An EEG was done showing toxic metabolic encephalopathy The patient was made a palliative care, DO NOT RESUSCITATE  patient   ALLERGIES: Penicillin, codeine, and erythromycin   MEDICATIONS:  1. Spironolactone 50 mg a day. 2. Bupropion 150 mg every 12 hours, extended-release. 3. Celexa 20 mg a day. 4. Lactulose 30 mL twice a day.  5. Percocet p.r.n. for pain.  6. Xifaxan 550 mg twice a day.  7. Lasix 20 mg a day.  8. Protonix 40 mg twice a day. 9. Keppra 500 mg twice a day.  PHYSICAL EXAMINATION:   GENERAL: Today the patient is awake, alert, and somewhat oriented. She knows who I am, but she has some confusion and confabulation.   HEENT: Sclerae are icteric. Conjunctivae negative. Tongue negative. Head is atraumatic.   CHEST: Clear in the anterior fields.   HEART: No murmurs or gallops I can hear.   ABDOMEN: Distended. There are some bowel sounds audible. There does not appear to be peritoneal-type tenderness.   EXTREMITIES: There is muscle wasting of her extremities. She has trace edema on the right, none on the left.   LABS: White count 11.6, hemoglobin 8.1, and platelet count 181. BUN 25 and creatinine 1.36. Pro time 18.3 with INR 1.5. ALT 41 and AST 56. Glucose 106. Total bilirubin 5.1.   ASSESSMENT AND RECOMMENDATIONS: Upper GI bleed in a patient with known cirrhosis and ascites who was recently in the hospital. Most likely choices are esophageal variceal bleeding or portal hypertensive gastropathy. I cannot of course rule out ulcer disease or AVMs in the upper GI tract. The plan is to have her admitted to the hospital and do an upper endoscopy on later today or tomorrow.  Put her on an octreotide drip and Protonix drip. Watch for altered mental status.  She has two peripheral IVs in now. She may need a central line depending on her clinical course. We will follow with you.  ____________________________ Scot Junobert T. Vyctoria Dickman, MD rte:slb D: 12/14/2011 06:21:01 ET     T: 12/14/2011 07:31:06 ET        JOB#: [id number] cc: Scot Junobert T. Debera Sterba, MD, <Dictator> Scot JunOBERT T Amaad Byers MD ELECTRONICALLY SIGNED  12/15/2011 9:26

## 2014-12-01 NOTE — Discharge Summary (Signed)
PATIENT NAME:  Alyssa Soto, PINCOCK MR#:  [id number] DATE OF BIRTH:  Apr 18, 1956  DATE OF ADMISSION:  12/14/2011 DATE OF DISCHARGE:  12/17/2011  REASON FOR ADMISSION: Hematemesis.   DISCHARGE DIAGNOSES:  1. Hematemesis.  2. Nonbleeding esophageal varices status post banding. 3. Gastritis.  4. Portal hypertensive gastropathy.  5. Acute posthemorrhagic anemia secondary to hematemesis.  6. Esophageal varices. 7. Ascites.  8. End-stage liver disease/cirrhosis complicated by chronic anemia, thrombocytopenia, coagulopathy, portal hypertension, ascites, hepatic encephalopathy. 9. Acute renal failure.  10. Hepatic encephalopathy. 11. HCV. 12. Former alcohol abuse. 13. Hypoalbuminemia from liver cirrhosis.  14. Coagulopathy.   15. Chronic anemia. 16. Thrombocytopenia. 17. History of chronic pain and narcotic dependence. 18. Narcotic abuse and drug-seeking behavior.  19. Major depressive disorder.  20. History of alcohol withdrawal/DTs and alcohol withdrawal seizures. 21. History of toxic metabolic encephalopathy.   CODE STATUS: DO NOT RESUSCITATE.   CONSULTATIONS:  1. GI with Dr. Mechele Collin  2. Palliative Care with Dr. Harvie Junior    DISCHARGE DISPOSITION: Hospice home    DISCHARGE MEDICATIONS:  1. Roxanol 20 mg/mL 0.5 mL p.o. or sublingually q.1 to 2 hours p.r.n. pain or dyspnea until relieved.  2. Lorazepam 0.5 to 1 mg p.o. sublingually q.2 to 4 hours p.r.n. agitation or anxiety.  3. Ranitidine 150 mg p.o. b.i.d.  4. ABHR suppository one PR q.4 to 6 hours p.r.n. nonobstructed refractory nausea, vomiting. 5. Spironolactone 25 mg p.o. b.i.d.  6. Lactulose 10 grams/15 mL 30 mL p.o. b.i.d.  7. Xifaxan 550 mg p.o. b.i.d.  8. Lasix 20 mg p.o. daily. 9. Protonix 40 mg p.o. b.i.d.  10. Multivitamin 1 tablet p.o. daily.  11. Nadolol 20 mg p.o. daily.   DISCHARGE CONDITION: Has not significantly improved, however, the patient is no longer having any hematemesis. She is currently hemodynamically  stable but felt to be with poor overall prognosis.   DISCHARGE ACTIVITY: As tolerated.   DISCHARGE DIET: As tolerated.   PROCEDURES:  1. Ultrasound-guided paracentesis 12/17/2011 successful ultrasound directed paracentesis with removal of 6000 mL of clear yellow fluid.  2. EGD 12/16/2011 by Dr. Mechele Collin grade II esophageal varices with no stigmata of recent bleeding. No active bleeding. These were banded and completely eradicated. There was gastritis noted and hiatal hernia. Normal examined duodenum.   PERTINENT LABORATORY DATA: BUN 25, creatinine 1.36 on admission with BUN 42 and creatinine 1.66 on the day of discharge. LFTs on admission total protein 6.5, albumin 2.3, total bilirubin 2.1, AST 56, ALT 41, alkaline phosphatase 182. CBC at the time of admission WBC 11.6, hemoglobin 8.9, hematocrit 26.5, platelets 181. Hemoglobin fell to 7.8 on 12/14/2011. The patient was transfused blood and hemoglobin is 9.5 on the day of discharge. Platelet count 182. INR 1.5 on admission, 1.3 on the day of discharge. Stool for occult blood was positive 12/14/2011.   BRIEF HISTORY/HOSPITAL COURSE: The patient is a 59 year old female with past medical history of alcohol abuse, HCV, liver cirrhosis, end-stage liver disease, chronic anemia, thrombocytopenia, chronic coagulopathy, portal hypertension, recurrent hepatic cephalopathy, chronic ascites, chronic pain syndrome with narcotic dependence, history of narcotic abuse and drug-seeking behavior, history of tobacco abuse, major depressive disorder, history of alcohol withdrawal and DTs and withdrawal seizures who presented to the Emergency Department with significant hematemesis. Please see dictated admission history and physical for pertinent details surrounding the onset of this hospitalization. Please see below for further details.  1. Hematemesis for which the patient was initially kept n.p.o. and started on Protonix and octreotide drips as well  as volume  resuscitation with IV fluids. There was initial concern for possible variceal bleed. GI was consulted and initially Dr. Mechele CollinElliott was in agreement with overall medical management plan by keeping patient n.p.o. and keeping her on IV fluids, Protonix, and octreotide drips and watching her hemoglobin and hematocrit closely and transfusing her as necessary. The patient did have some drop in her hemoglobin and was transfused blood secondary to acute posthemorrhagic anemia with hematemesis and given mild coagulopathy she was also transfused FFP. With all these measures, her hematemesis appeared to have subsided. Dr. Mechele CollinElliott recommended EGD for further assessment. The patient went for EGD on date mentioned above and was noted to have esophageal varices without any stigmata of active or recent bleeding and these were banded to prevent bleeding. EGD also showed retrograde movement of large stomach causing inflammation with scattered erythematous spots and gastritis, also possible portal hypertensive gastropathy contributing to episodic bleeding as per Dr. Earnest ConroyElliott's evaluation. As there was no active variceal bleeding noted, Dr. Mechele CollinElliott recommended discontinuing octreotide drip and recommended continuation of Protonix but recommended transitioning the patient off the Protonix drip onto b.i.d. PPI which was performed. She had no further hematemesis noted throughout the remainder of this hospitalization and her hemoglobin and hematocrit appears to have improved and stabilized posttransfusion. She does have chronic anemia secondary to end-stage liver disease. Once EGD with complete, she was slowly started on a diet, initially liquid diet. She was tolerating it well but was not eating much per nurse's assessment.  2. For esophageal varices, there was no active bleeding noted and these were banded by Dr. Mechele CollinElliott. She will also continue nadolol to help with reducing portal hypertensive pressures.  3. For ascites, this was felt to  be transudative from cirrhosis and hypoalbuminemia and she was restarted on diuretics but still had significant abdominal discomfort from the swelling and underwent paracentesis on the day of discharge. This has led to significant improvement of her abdominal swelling and her abdominal discomfort and pain have resolved. Initially she was kept on IV Rocephin for possible SBP but this was felt to be less likely given lack of fevers and once she had paracentesis performed she had no further abdominal pain. She will resume Lasix and Aldactone at the Hospice home at the time of discharge as part of comfort as she will be prone to abdominal swelling and ascites in the future.  4. In regards to cirrhosis and end-stage liver disease, this was complicated by chronic anemia, thrombocytopenia, coagulopathy, portal hypertension, ascites, and hepatic encephalopathy. The patient is considered to have a very poor overall prognosis. She was seen in consultation by Palliative Care team and upon further discussion with the patient as well as her daughter, Leotis ShamesLauren, the patient and daughter decided to update the patient's CODE STATUS to DO NOT RESUSCITATE. The patient and daughter, Leotis ShamesLauren, were also interested in the patient being discharged to the Hospice home and Palliative Care felt that the patient was an appropriate candidate for discharge to the Hospice home.  5. In regards to ascites, anasarca, and portal hypertension, the patient will be maintained on diuretics. This may help provide some comfort.  6. For hepatic encephalopathy, the patient will be maintained on lactose and Xifaxan.  7. For esophageal varices along with portal hypertensive gastropathy, the patient will be discharged on nadolol and PPI therapy.  8. The patient had periods of intermittent confusion during this hospitalization but her ammonia level has been less than 25 when checked and rechecked and she  will be on lactulose and Xifaxan for TSE prophylaxis.   9. The patient was noted to have acute renal failure as well and it was felt that this could be prerenal in etiology initially from volume depletion caused by hematemesis and poor p.o. intake. She was hydrated with IV fluids but her creatinine did not improve. Thereafter it was felt that her renal failure may possibly be due to volume overload and anasarca versus possible hepatorenal syndrome. Her renal function worsened so fluids were stopped as above. She was restarted on Lasix and also on Aldactone and remained nonoliguric. Her renal function has also worsened some despite Lasix and there was a concern for possible hepatorenal syndrome. No further work-up is being performed for now as the patient is being discharged to the Hospice home as per patient and her daughter, Lauren's request and patient was in agreement with this plan of not pursuing further work-up for renal failure and this was discussed with her on the day of discharge at a point when she was not confused.   10. For former alcohol abuse, the patient stated that she had not consumed any alcohol since November 2012 and she was commended on her efforts towards maintaining sobriety and being abstinent from alcohol consumption.   Though the patient's hematemesis has resolved and she is currently hemodynamically stable, she was felt to have poor overall prognosis and is felt to be appropriate for discharge to the Hospice home as per Palliative Care's recommendations.      On 12/17/2011, the patient was hemodynamically stable and felt to be stable for discharge to Hospice home to which the patient was agreeable and her daughter, Leotis Shames, was also agreeable.   ____________________________ Elon Alas, MD knl:[id number] D: 12/21/2011 19:45:36 ET T: 12/22/2011 11:47:08 ET JOB#: [id number]  cc: Elon Alas, MD, <Dictator> [facility] Elon Alas MD ELECTRONICALLY SIGNED 12/30/2011 15:05

## 2014-12-01 NOTE — Consult Note (Signed)
T max 99.5, on 2L VSS  itherwise.  Lethargic but opens eyes on request.  Dubhoff in and tol feedings.  Pt with hx of Hep C and alcoholism who went into acute and severe delerium and likely had an unwitnessed seizure and bite her tongue causing local bleeding 3 days after admission.  With no signif elevation in NH3.  Although patient maintained she had not been drinking I doubt the  believability of her story.  Exam tday shows distended abd with percussion c/w signif ascites, 2 + ankle edema.  Continue course for now.  Will stop Octreotide.   Electronic Signatures: Scot JunElliott, Erynne Kealey T (MD)  (Signed on 24-Apr-13 12:56)  Authored  Last Updated: 24-Apr-13 12:56 by Scot JunElliott, Mathias Bogacki T (MD)

## 2014-12-01 NOTE — Consult Note (Signed)
Pt weight 129 lbs.  Aldactone and lasix started at low doses to try to keep ascites from forming quickly.  Will continue to follow daily weights.  Ate on average 50% of meals.  No new suggestions.  Electronic Signatures: Scot JunElliott, Suliman Termini T (MD)  (Signed on 02-May-13 14:58)  Authored  Last Updated: 02-May-13 14:58 by Scot JunElliott, Elizar Alpern T (MD)

## 2014-12-01 NOTE — Consult Note (Signed)
NH3 up to 59, she had 6L tapped from abd and feels better.  Will restart Xifaxan.  WBC 13, hgb 10, on 3L nasal canula.  Abd much less distended.   Electronic Signatures: Scot JunElliott, Auda Finfrock T (MD)  (Signed on 30-Apr-13 18:39)  Authored  Last Updated: 30-Apr-13 18:39 by Scot JunElliott, Aava Deland T (MD)

## 2014-12-01 NOTE — Consult Note (Signed)
Pt a little disoriented, doesn't realize she is in the hospital at times. Pt VSS, afebrile, on laculose and Xifaxan,  Hgb 9.8, plt 170,  WBC 13, PT 18.6, creat 1.6.  Will do EGD tomorrow with possible banding.  Clear liq today.  Electronic Signatures: Scot JunElliott, Robert T (MD)  (Signed on 08-May-13 08:25)  Authored  Last Updated: 08-May-13 08:25 by Scot JunElliott, Robert T (MD)

## 2014-12-01 NOTE — Consult Note (Signed)
Weight went from 129 to 132 in one day.  Eating some.  electrolytes ok.  CBC stable.  She is to go to post hospital care.  Continue meds.  I will sign off, reconsult if needed.  Electronic Signatures: Scot JunElliott, Jess Toney T (MD)  (Signed on 03-May-13 18:50)  Authored  Last Updated: 03-May-13 18:50 by Scot JunElliott, Itati Brocksmith T (MD)

## 2014-12-01 NOTE — Consult Note (Signed)
Patient in acute delerium, not purposeful responsive, some thrashing about, decreased with haldol somewhat.  Blood in mouth may be due to tongue bite, could have vomited blood not observed. Could have vomited but no blood on bed or clothes.  Timing is about right given admit 3 days ago for alcohol withdrawal.  See consult dictated. Recommend treatment for DT's with covering possible sepsis.  Pallative care may be needed.  Head CT on admission was neg.  Suggest psy consult if you are not reasonibly sure she has DT's. full panel of labs in morning to include CPK and magnesium.  No plans for EGD at the moment.  Electronic Signatures: Scot JunElliott, Robert T (MD)  (Signed on 20-Apr-13 19:07)  Authored  Last Updated: 20-Apr-13 19:07 by Scot JunElliott, Robert T (MD)

## 2014-12-01 NOTE — Consult Note (Signed)
Pt about same, WBC increased, CXR with bilat atelectasis vs infiltrates, given large abd this could contribute to decreased diaphragm movement causing atelectasis.  Has foley and rectal tube in.  Central line inserted.  Edema of right arm.  Pt made DNR.  Given abd findings and increased WBC could be spont bact peritonitis, good coverage with Invanz.  No new recommendations.  Electronic Signatures: Scot JunElliott, Robert T (MD)  (Signed on 25-Apr-13 16:43)  Authored  Last Updated: 25-Apr-13 16:43 by Scot JunElliott, Robert T (MD)

## 2014-12-01 NOTE — Consult Note (Signed)
Referring Physician:  Albertine Patricia   Primary Care Physician:  Nonlocal MD, MD :   Reason for Consult:  Admit Date: 24-Nov-2011   Chief Complaint: fall   Reason for Consult: altered mental status; question seizure activity   History of Present Illness:  History of Present Illness:   Ms. Kashish Yglesias is seen in consultation at the request of Dr. Leslye Peer for evaluation of altered mental status with concern for possible seizure. Nevitt is a 59 year-old female with past medical history significant for liver cirrhosis, hepatic encephalopathy, hepatitis C virus, and alcohol abuse who was hospitalized on 11/24/2011 s/p fall. History was unable to be obtained from the patient today, as her mental status has been agitated since yesterday, and she had just received a dose of ativan 2 mg prior to my arrival at her bedside. Per my review of the patient's medical chart, it appears that she had suffered a mechanical fall in the bathroom on 11/24/2011. On admission, she was noted to be awake, alert, and orented x 3. Per admission H&P, the patient had reported that she had been compliant with her medications and had not had any alcohol to drink since November 2012. Alcohol level was not checked on admission. The patient apparently had been doing well during her hospital course up until yesterday, when she was noted to be acutely confused in the middle of the day. She was started on CIWA. Primary team notes that she was hallucinating and had a course tremor, consistent with hepatic encephalopathy. The patient was noted to have blood around her lips and mouth. She was transferred to the CCU. her nurse at the bedside, there was no report of any seizure activity that was witnessed. The nurse notes that the patient had been so agitated yesterday, that it took 4 people to hold her down. This morning, the patient was still hallucinating, seemingly reaching out at imaginary objects in front of her. She has been started  on Keppra.   ROS:   Review of Systems   Unable to obtain review of systems due to patient's altered mental status  Past Medical/ Surgical Hx:   Past Medical History 1. 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  2. Hepatitis C virus.  3. History of hepatic encephalopathy. 4. History of urinary tract infection.  5. History of alcohol abuse, resolved. No drinking for the last five months.  6. Major depressive disorder.  7. Chronic pain syndrome.  8. History of opioid dependence.    Past Surgical History 1. Tonsillectomy.  2. Total hysterectomy.   Home Medications: Medication Instructions Last Modified Date/Time  Aldactone 25 mg tablet 1 tab(s) orally 2 times a day x 30 days 17-Apr-13 03:17  Constulose 10 g/15 mL syrup 30 mL orally 2 times a day x 30 days 17-Apr-13 03:17  spironolactone 25 mg oral tablet 1 tab(s) orally 2 times a day 17-Apr-13 03:17  buPROPion 150 mg/12 hours (SR) oral tablet, extended release 1 tab(s) orally 2 times a day 17-Apr-13 03:17  thiamine 250 mg oral tablet 1 tab(s) orally once a day 17-Apr-13 03:17  tramadol 50 mg oral tablet 1 tab(s) orally every 4 hours 17-Apr-13 03:17  Xifaxan 550 mg oral tablet 1 tab(s) orally once a day 17-Apr-13 03:17  potassium chloride 20 mEq oral tablet, extended release 1 tab(s) orally 2 times a day for 5 days 17-Apr-13 03:17  lactulose 10 g/15 mL oral syrup 30 milliliter(s) orally 2 times a day, titrate to 2-3  bowel movements/day 17-Apr-13 03:17  Lasix 40 mg oral tablet 1 tab(s) orally once a day 17-Apr-13 03:17  multivitamin 1 cap(s) orally once a day 17-Apr-13 03:17  Celexa 20 mg oral tablet 1 tab(s) orally once a day 17-Apr-13 03:17  magnesium oxide 400 mg oral tablet 1 tab(s) orally 2 times a day x 5 days 17-Apr-13 03:17   [facility] Current Meds:  Sodium Chloride 0.9%, 1000 ml     Multivitamin Conc 12 injection 10 ml     Thiamine injection 845 mg     Folic Acid injection 1 mg     Magnesium Sulfate  injection 1 gram at 40 ml/hr, Instructions: Run over 24 hours x 3 days, Stop After: 3 Days  Dexmedetomidine Drip,  ( Precedex Drip ) 400 mcg /NS 100 ml in Sodium Chloride 0.9% 96 ml  Concentration: (4 mcg/ml) - Final Volume: 100 mL, Dose Rate: 0.2 mcg/kg/hr at initial rate of 3.1 ml/hr  -Indication:sedation  Octreotide Drip, ( Sandostatin Drip ) 1200 mcg in Sodium Chloride 0.9% 250 ml  Concentration: (4.8 mcg/ml), Dose Rate: 50 mcg/hr at initial rate of 10.4 ml/hr  -Indication:arrheal/ Acromegaly/ Carcinoid Tumors/VIPomas/ Variceal Bleeding  buPROPion SR tablet, ( Wellbutrin SR tablet)  150 mg Oral bid  - Indication: Depression  Instructions:  DO NOT CRUSH  Citalopram tablet,  ( CeleXA)  20 mg Oral daily  - Indication: Depression  Spironolactone tablet,  ( Aldactone)  25 mg Oral bid/wm  - Indication: Hypertension/ Edema/ Hypokalemia/ Primary Hyperaldosteronism  Furosemide tablet,  ( Lasix)  40 mg Oral daily  - Indication: Diuresis  Lactulose 10G/44m Syrup, ( Cephulac 10G/14msyrup )  30 ml Oral bid  -Indication:Constipation/ Encephalopathy  Magnesium Oxide tablet, ( Mag-Ox)  400 mg Oral daily  - Indication: Prevention of Magnesium Deficiency  Multivitamin capsule, ( Vitamin - Multiple)  1 capsule(s) Oral daily  - Indication: Prevention and Treatment of Vitamin Deficiencies  Instructions:  [Waste Code: Black]  Thiamine tablet, ( Thiamine HCI)  100 mg Oral daily  - Indication: Thiamine (B1) Deficiency  Tramadol tablet, ( Ultram)  50 mg Oral q6h PRN for pain  - Indication: Pain  Rifaximin tablet,  ( Xifaxan)  550 mg Oral bid  - Indication: traveler's diarrhea  Sodium Chloride 0.9% injection, 2 ml, IV push, q6h  Acetaminophen-HYDROcodone 325/5 mg tablet,  ( Norco  5/325 mg)  1 to 2 tablet(s) Oral q4-6h PRN for pain  - Indication: Pain  Phytonadione tablet,  ( Vitamin K)  5 mg Oral daily  Stop After: 2 Days  - Indication: Vit K Deficiency/Impared Fat Absorption/Hemolytic  Anemia Premature Infants  Potassium Chloride ER tablet, 20 mEq Oral <User Schedule> ( every 1 day: 19:00 )  - Indication: Hypokalemia  Instructions:  DO NOT CRUSH  Haloperidol injection, ( Haldol injection )  2 mg, IV push, q4h PRN for hallucinations, paranoia, agitation not calmed by Ativan  Indication: Psychosis/ Delirium, for hallucinations, paranoia, agitation not calmed by Ativan.  Don't give for sedation only.  LORazepam injection, ( Ativan injection )  2 mg, IV push, q1h PRN for Anxiety, seizure, sedation.  Stop After:  24 Hours  Indication: Anxiety/ Seizure/ Antiemetic Adjunct/ Preop Sedation, for first 24 hours not to exceed 16 mg/24 hours per Alcohol Detox orders.  Levofloxacin injection,  ( Levaquin injection )  500 mg, IV Piggyback, q48h, Infuse over 60 minute(s)  Indication: Infection, per pharmacy dosing  Pantoprazole injection,  ( Protonix injection )  40 mg, IV push, q12h  Indication: Erosive Esophagitis/ GERD, 1. Reconstitute Protonix with 64m of Normal Saline; concentration= 497mmL. Once reconstituted; Protonix must be administered within 30 minutes.  2. I.V. line must be flushed before and after administration. Administer 1048m29m51m.V. push over 2-3 minutes.  LORazepam injection, ( Ativan injection )  2 mg, IV push, q2h PRN for anxiety, seizure, sedation.  Stop After:  48 Hours  Indication: Anxiety/ Seizure/ Antiemetic Adjunct/ Preop Sedation, for next 48 hours not to exceed 16 mg/24 hours per Alcohol Detox orders.  Lactulose 10G/15ml30mup, ( Cephulac 10G/15ml 16mp )  30 ml Rectal q8h  -Indication:Constipation/ Encephalopathy  Ertapenem injection,  ( INVanz injection )  1 gram in Sodium Chloride 0.9% 50 ml, IV Piggyback, q24h, Infuse over 30 minute(s)  Indication: Infection  levETIRAcetam injection,  ( Keppra injection )  1000 mg in Dextrose 5% 100 ml, IV Piggyback, STAT, Infuse over 15 minute(s), Mixed in 100 ml given over 15 minutes.  levETIRAcetam injection,  (  Keppra injection )  500 mg in Dextrose 5% 100 ml, IV Piggyback, q12h, Infuse over 15 minute(s), Mixed in 100 ml given over 15 minutes.  Allergies:  Codeine: Itching  Ampicillin: Itching, Rash  Tylenol with Codeine #3: Rash, Itching  Social/Family History:  Social History: Per medical records, the patient does not abuse alcohol any more, last drink in November of last year. Smokes one cigarette per day. Denies any history of IV drug abuse. Lives at home by herself.   Family History: Per medical records, significant for biliary stenosis in her mother.   Vital Signs: **Vital Signs.:   21-Apr-13 06:30   Vital Signs Type Routine   Celsius 96.3   Temperature Source rectal   Pulse Pulse 66   Pulse source per Dinamap   Respirations Respirations 20   Systolic BP Systolic BP 103   465stolic BP (mmHg) Diastolic BP (mmHg) 58   Mean BP 73   Pulse Ox % Pulse Ox % 99   Pulse Ox Activity Level  At rest   Oxygen Delivery 2L; Nasal Cannula   Pulse Ox Heart Rate 66   Physical Exam:  General: No acute distress, appears older than stated age   HEENT:63ous amount of black, dried blood around mouth and on tongue, no obvious tongue laceration noted (though view is limited by amount of dry blood)   Neck: Supple   Chest: Clear anteriorly   Cardiac: RRR, no murmurs/rubs/gallops   Extremities: No edema   Neurologic Exam:  Mental Status: Had just received ativan about ten minutes prior to my arrival and was also on Precedex. Mental status is sedated, patient does not open eyes or follow commands. She moans in response to noxious stimulus.   Cranial Nerves: Pinpoint pupils bilaterally, unresponsive to light. Intact corneal reflexes bilaterally. Face grossly symmetric.   Motor Exam: No spontaneous movements. Withdraws to noxious stimulus in all four extremities.   Deep Tendon Reflexes: Symmetric and brisk, 2+ throughout. Toes equivocal bilaterally.   Sensory Exam: Withdraws to noxious stimulus in all  four extremities.   Coordination: Unable to assess due to sedated mental status   Cerebellar Exam: Unable to assess due to sedated mental status   Gait: Unable to assess due to sedated mental status   Lab Results: Hepatic:  18-Apr-13 05:18    Bilirubin, Total   5.3   Alkaline Phosphatase 62   SGPT (ALT) 30   SGOT (AST)   78   Total Protein, Serum   5.7   Albumin,  Serum   2.5  Routine Chem:  20-Apr-13 18:08    Magnesium, Serum 1.9   Phosphorus, Serum 3.0   Ammonia, Plasma < 25  21-Apr-13 04:14    Glucose, Serum   114   BUN 17   Creatinine (comp) 1.11   Sodium, Serum 143   Potassium, Serum 3.9   Chloride, Serum   111   CO2, Serum 25   Calcium (Total), Serum   8.3   Anion Gap 7   eGFR (African American) >60   eGFR (Non-African American)   57  Cardiac:  17-Apr-13 04:24    CK, Total   415   CPK-MB, Serum   3.7   Troponin I < 0.02  Routine UA:  17-Apr-13 10:50    Color (UA) Amber   Clarity (UA) Hazy   Glucose (UA) Negative   Bilirubin (UA) Negative   Ketones (UA) Negative   Specific Gravity (UA) 1.020   Blood (UA) Negative   pH (UA) 5.0   Protein (UA) Negative   Nitrite (UA) Negative   Leukocyte Esterase (UA) Negative   RBC (UA) <1 /HPF  Routine Coag:  21-Apr-13 04:14    Prothrombin   22.1   INR 1.9  Routine Hem:  20-Apr-13 18:08    Segmented Neutrophils 72   Lymphocytes 14   Monocytes 12   Eosinophil 2  21-Apr-13 04:14    WBC (CBC) 10.9   RBC (CBC)   2.47   Hemoglobin (CBC)   9.0   Hematocrit (CBC)   26.7   Platelet Count (CBC)   76   MCV   108   MCH   36.2   MCHC 33.6   RDW   17.5   Neutrophil % 73.2   Lymphocyte % 14.3   Monocyte % 10.4   Eosinophil % 2.0   Basophil % 0.1   Neutrophil #   8.0   Lymphocyte # 1.6   Monocyte #   1.1   Eosinophil # 0.2   Basophil # 0.0   Radiology Results: CT:    17-Apr-13 04:05, CT Head Without Contrast   CT Head Without Contrast    REASON FOR EXAM:    fall with head trauma  COMMENTS:       PROCEDURE:  CT  - CT HEAD WITHOUT CONTRAST  - Nov 24 2011  4:05AM     RESULT:     Comparison is made to a prior study dated 09/26/2011.    TECHNIQUE: Helical noncontrast 5 mm sections were obtained from the skull   base through the vertex.    FINDINGS: Mild areas of low attenuation project within the subcortical,   deep, and periventricular white matter regions. The ventricles and   cisterns are patent. There is no evidence of subfalcine ortonsillar     herniation. There is no evidence of a depressed skull fracture. No   intra-axial or extra-axial fluid collections are identified. There is no   evidence of acute hemorrhage. There is no evidence of a depressed skull   fracture. The visualized paranasal sinuses and mastoid air cells are   patent.    IMPRESSION:      1.  Mild involutional changes without evidence of acute abnormalities.  2.  Dr. Owens Shark of the Emergency Department was informed of these findings   via a preliminary faxedreport.      Thank you for this opportunity to contribute to the care of your patient.       Verified By: Cori Razor  W. COOPER, M.D., MD   Impression/Recommendations:  Recommendations:   59 year-old female with past medical history significant for liver cirrhosis, hepatic encephalopathy, hepatitis C virus, and alcohol abuse who was hospitalized on 11/24/2011 s/p fall, now with altered mental status and possible seizure activity, concerning for alcohol withdrawal. Patient's mental status became acutely altered on 4/20, about 72 hours after admission. She started having hallucinations and agitated behavior. Although patient had denied any alcohol use since November 2012, her alcohol abuse history and the timing of her altered mental status raise the concern that she may be in alcohol withdrawal. with primary team's decision to check repeat head CT this morning.to monitor and treat signs/symptoms of alcohol withdrawal. Agree with using Precedex and ativan PRN for now, wean off  sedating medications as patient's mental status tolerates.with continuing lactulose. seizure activity: No seizure activity has been witnessed by nursing staff, but patient does have a copious amount of dried blood around her mouth, possibly from biting her tongue. If she did have a seizure, it could be due to alcohol withdrawal. Patient loaded with Keppra 1000 mg x 1 this morning and started on 500 mg Q12H for maintenance dosing.is reasonable to continue empirically treating patient with Keppra 500 mg Q12H, especially given her underlying liver disease which precludes her from being on other anti-epileptic medications.is on levofloxacin for possible pneumonia; please be aware that levofloxacin (and other fluoroquinolones) can potentially lower the seizure threshold.is also on buproprion for depression; buproprion can also lower one's seizure threshold.consider discontinuing the buproprion and/or levofloxacin if patient has more witnessed seizure activity.routine EEG tomorrow morning.to monitor for signs/symptoms of alcohol withdrawal. you for this consult.  Electronic Signatures: Rennis Chris (MD)  (Signed 21-Apr-13 08:58)  Authored: REFERRING PHYSICIAN, Primary Care Physician, Consult, History of Present Illness, Review of Systems, PAST MEDICAL/SURGICAL HISTORY, HOME MEDICATIONS, Current Medications, ALLERGIES, Social/Family History, NURSING VITAL SIGNS, Physical Exam-, LAB RESULTS, RADIOLOGY RESULTS, Recommendations   Last Updated: 21-Apr-13 08:58 by Rennis Chris (MD)

## 2014-12-01 NOTE — Consult Note (Signed)
Pt CT of head neg for acute problems.  Pt very asleep , VSS afebrile.  Dried blood on tongue and lips, bled when nurse tried to clean her.  Chest shows exp ronchi likely from upper airway mucus/blood.  abd still distended but softer since she is asleep.  Total picture c/w alcohol DT's of a severe nature.  Likely had a brief unwitnessed seizure and bit her tongue.  Plt ct noted to be falling.  No new suggestions,.  Would continue Octreotide drip for 72 hours total.   Electronic Signatures: Scot JunElliott, Robert T (MD)  (Signed on 21-Apr-13 12:08)  Authored  Last Updated: 21-Apr-13 12:08 by Scot JunElliott, Robert T (MD)

## 2014-12-01 NOTE — H&P (Signed)
PATIENT NAME:  Alyssa Soto, Alyssa Soto MR#:  [id number] DATE OF BIRTH:  1956/01/21  DATE OF ADMISSION:  11/24/2011  PRIMARY CARE PHYSICIAN:  Dr. Juanetta Gosling REFERRING PHYSICIAN:  Dr. Orvilla Cornwall  CHIEF COMPLAINT: Status post fall.   HISTORY OF PRESENT ILLNESS:  Alyssa Soto is a 59 year old female with significant past medical history of liver cirrhosis, hepatic encephalopathy, hepatitis C virus, and alcohol abuse who presents to the ED status post fall. The patient reports she has been living by herself, reports she has been compliant with her medications and quit drinking last November. No alcohol- not a single alcohol drink since then. The patient reports she was in the bathroom and she tripped on the bathroom carpet. She held onto the shower curtain when she fell to the floor, where she hit her right chest area. She was on the floor for two hours and was screaming for help. Her downstairs neighbors came to help and called the paramedics for her. Upon admission to the ED the patient had vital signs within normal limits. She had multiple abnormalities with her blood work, which basically reflects her advanced stage of liver cirrhosis without much change from her previous baseline. The patient had elevated ammonia level of 50 but patient reports she has been taking her medication all the time. The patient was awake, alert, oriented times three, and not confused.   PAST MEDICAL HISTORY: 1. 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  2. Hepatitis C virus.  3. History of hepatic encephalopathy. 4. History of urinary tract infection.  5. History of alcohol abuse, resolved. No drinking for the last five months.  6. Major depressive disorder.  7. Chronic pain syndrome.  8. History of opioid dependence.  PAST SURGICAL HISTORY: 1. Tonsillectomy.  2. Total hysterectomy.  ALLERGIES: Ampicillin, codeine, Tylenol with codeine #3.   SOCIAL HISTORY:  The patient does not abuse alcohol any more, last  drink in November of last year. Smokes one cigarette per day. Denies any history of IV drug abuse. Lives at home by herself.   FAMILY HISTORY: Significant for biliary stenosis in her mother.   HOME MEDICATION:  1. Aldactone 25 mg oral 2 times a day.  2. Bupropion 150 mg SR 2 times a day. 3. Celexa 20 mg daily. 4. Lactulose 30 mL 2 times a day.  5. Lasix 40 mg daily. 6. Magnesium oxide 400 mg 2 times a day.  7. Multivitamin 1 tablet daily.  8. Potassium 20 mEq p.o. b.i.d.  9. Thiamine 250 mg oral daily.  10. Tramadol 50 every four hours. 11. Xifaxan 550 mg oral daily.   REVIEW OF SYSTEMS: Patient denies any fever, fatigue, or weakness. EYES: Denies any blurry vision, double vision, or pain.  ENT: Denies any tinnitus, ear pain, or hearing loss. RESPIRATORY: Denies any cough, wheezing, hemoptysis, or dyspnea. CARDIOVASCULAR: Denies any chest pain or orthopnea, has edema which is at her baseline.  GI: Denies any nausea, vomiting, diarrhea, or abdominal pain. GU: Denies any dysuria, hematuria, or renal colic. ENDO: Denies any polyuria, polydipsia, or heat or cold intolerance.  HEMATOLOGIC: Has history of thrombocytopenia, easy bruising. NEUROLOGIC: Denies any numbness weakness, dysarthria, or epilepsy. PSYCH: Has history of major depressive disorder.   PHYSICAL EXAMINATION: VITAL SIGNS: Temperature 98.1, pulse 90, respiratory rate 16, blood pressure 112/63, saturation 94% on room air.   GENERAL: Cathectic female, comfortable in bed in no apparent distress.   HEENT: Jaundiced sclerae. Moist oral mucosa.   NECK: Supple. No thyromegaly.  No JVD.   CHEST: Good air entry bilaterally. No wheezing, rales, or rhonchi.  CARDIOVASCULAR: S1, S2. No rubs, murmur, or gallops.   ABDOMEN:  Ascites. Nontender. Bowel sounds present.  EXTREMITY: +2 to 3 lower extremity edema.    NEURO: Cranial nerves grossly intact. Motor strength five out of five in four extremities.  PSYCH: Appropriate affect. Awake,  alert, oriented times three. Intact judgment and insight.   PERTINENT LABS: Ammonia 50, glucose 62, BUN 15, creatinine 1.46, sodium 139, potassium 3.4, chloride 103, CO2 24, osmolality 276, total protein 6.2, albumin 2.8, bilirubin 5.1, alkaline phosphatase 72, AST 65, ALT 27, troponin less than 0.02. White blood cells 12.5, hemoglobin 9.6, platelets 118, INR 1.8.   ASSESSMENT AND PLAN:  1. Status post fall:  This is secondary to mechanical fall as the patient tripped on the bathroom mat.  There is no confusion, no altered mental status. No loss of consciousness. Secondary to patient's right chest tenderness we will check rib x-ray to rule out any fractures and we will admit for observation. We will consult physical therapy as well.  2. Liver cirrhosis:  This is secondary to history of alcohol abuse, nothing recent, and due to hepatitis C virus. The patient did not appear to be in hepatic encephalopathy as she had no altered mental status even with elevated ammonia level. We will continue her on lactulose, Xifaxan, Aldactone, and Lasix. 3. Hypokalemia: We will replace.  4. Coagulopathy: This is secondary to her liver cirrhosis. We will avoid chemical anticoagulation.  5. We will start Protonix for GI prophylaxis and sequential compression devices for deep vein thrombosis prophylaxis.   6. CODE STATUS:  The  patient is FULL CODE but does not wish to remain on life support if she has poor prognosis.        TOTAL TIME SPENT ON PATIENT CARE: 55 minutes.   ____________________________ Starleen Armsawood S. Yadiel Aubry, MD [id number]:bjt D: 11/24/2011 08:04:46 ET T: 11/24/2011 09:21:51 ET JOB#: [id number]  cc: Starleen Armsawood S. Yorel Redder, MD, <Dictator> Janeann ForehandJames H. Hawkins Jr., MD Rithvik Orcutt Teena IraniS Evelina Lore MD ELECTRONICALLY SIGNED 11/25/2011 22:08

## 2014-12-01 NOTE — Consult Note (Signed)
PATIENT NAME:  Alyssa Soto, Alyssa Soto MR#:  [id number] DATE OF BIRTH:  1956/01/25  DATE OF CONSULTATION:  11/27/2011  REFERRING PHYSICIAN:   CONSULTING PHYSICIAN:  Scot Jun, MD  HISTORY OF PRESENT ILLNESS: The patient is a 59 year old female with a long history of alcohol abuse. She started drinking in high school or junior high with previous admissions to the psychiatry ward for alcohol abuse and withdrawal. She also has a history of Hepatitis C and cirrhosis of the liver. The patient was admitted on the 17th apparently after a fall and she was tremulous somewhat on admission and today has had full-blown delirium with inability to respond to normal verbal stimuli, thrashing about. She was given Ativan and some Haldol and was transferred to the unit.   At some time today she either bit her tongue or spit up some blood. There is only blood on the lips and the gums. There is a place on her tongue that has a dark patch of blood on it. The head is otherwise atraumatic.   REVIEW OF SYSTEMS: Unobtainable due to acute delirium.   She was admitted by Dr. Randol Kern after being seen by Dr. Manson Passey in the ER.   PAST MEDICAL HISTORY FROM OLD RECORDS: 1. Major depressive disorder.  2. History of opioid dependence.  3. Chronic pain syndrome.  4. History of alcohol abuse.  5. History of hepatic encephalopathy.  6. Hepatitis C.  7. Cirrhosis of the liver secondary to Hepatitis C and alcohol abuse.   PAST SURGICAL HISTORY:  1. Tonsillectomy.  2. Total hysterectomy.   SOCIAL HISTORY: On admission the patient said she has not had anything to drink since November. That may be inaccurate. She smokes one cigarette a day.   ALLERGIES: Ampicillin, codeine, Tylenol with Codeine.   HOME MEDICATIONS:  1. Aldactone 25 mg 2 times a day.  2. Bupropion 150 mg SR 2 times a day.  3. Celexa 20 mg a day.  4. Lactulose 30 mL 2 times a day.  5. Lasix 40 mg a day.  6. Magnesium oxide 400 mg 1 tablet twice a day.   7. Potassium 20 mEq b.i.d.  8. Thiamine 250 mg daily. 9. Tramadol 50 mg every four hours. 10. Xifaxan.   PHYSICAL EXAMINATION:   GENERAL: White female who looks older than stated age somewhat thrashing about in an uncoordinated, unintentional manner.   HEENT: The eyes show mild scleral icterus. Conjunctivae slightly pale. Head is atraumatic. Tongue and gums have some dry blood in there.  NECK: Trachea in the midline.   CHEST: Clear.   HEART: No murmurs I can hear.   ABDOMEN: Distended. Bowel sounds are diminished but are audible. She likely has significant ascites.   EXTREMITIES: No edema.   RECTAL: No stool in the vault. She is intermittently squeezing down on her abdomen causing slight prolapse.   LABORATORY, DIAGNOSTIC, AND RADIOLOGICAL DATA: Glucose 117, BUN 16, creatinine 1.38, sodium 145, potassium 3.8, chloride 109, CO2 26, calcium 8.3, total protein 5.7, albumin 2.5, total bilirubin 5.3, alkaline phosphatase 62, SGOT 78, SGPT 30, white blood count 16.9, hemoglobin 9.7, platelet count 105. Pro-time 19.9. INR 1.7. Arterial blood gas pH 7.4, pCO2 37, pO2 47 on room air.   CAT scan of the head shows no abnormalities except for chronic involutional changes.   ASSESSMENT: It appears likely that she is in DTs. The timing would fit with three days after admission in someone who has a chronic history of alcohol. I recommend checking a  magnesium level with the next blood drawn. Go ahead and treat her for major alcohol withdrawal. Her CPK was noted to be mildly elevated and that could be related to alcohol myopathy or early rhabdomyolysis from alcohol withdrawal. At this time I do not think an upper endoscopy is necessary. She does not appear to be having a major bleeding episode so I agree at this time with the use of octreotide drip and Protonix IV. It is possible she may need a central line inserted.   I will follow with you.   ____________________________ Scot Junobert T. Benjamim Harnish,  MD rte:drc D: 11/27/2011 19:25:56 ET T: 11/28/2011 07:47:45 ET JOB#: [id number]  cc: Scot Junobert T. Fredis Malkiewicz, MD, <Dictator> Scot JunOBERT T Kaesen Rodriguez MD ELECTRONICALLY SIGNED 12/15/2011 9:25

## 2014-12-01 NOTE — Consult Note (Signed)
Pt dozing, easily awoken, oriented, appropriate.  Abd mild distended.  Ext 1+ edema in legs. Says she ate more than 50% of her lunch.  Will change iv lasix to oral and start aldactone 50mg  a day.  Monitor daily weights.  Agree with post admission extended care.  Electronic Signatures: Scot JunElliott, Sadonna Kotara T (MD)  (Signed on 01-May-13 13:35)  Authored  Last Updated: 01-May-13 13:35 by Scot JunElliott, Tzipora Mcinroy T (MD)

## 2014-12-01 NOTE — Consult Note (Signed)
Pt very asleep, did not rouse during routine exam.  Neuro consult note noted.  Chest clear ant fields, abd with bowel sounds, soft distention likely ascites.  WBC 10, hgb 10, plt 87K, alb 2.2, TB down to 2.9.  Will check ammonia level today.  Would try to avoid ng tube if possible.  No new suggestions.  Electronic Signatures: Scot JunElliott, Elaisha Zahniser T (MD)  (Signed on 22-Apr-13 06:41)  Authored  Last Updated: 22-Apr-13 06:41 by Scot JunElliott, Marshaun Lortie T (MD)

## 2014-12-01 NOTE — Discharge Summary (Signed)
PATIENT NAME:  Alyssa Soto, Alyssa Soto MR#:  [id number] DATE OF BIRTH:  [birth year]  DATE OF ADMISSION:  11/24/2011 DATE OF DISCHARGE:  12/11/2011  ADMITTING DIAGNOSIS: Fall and confusion.   DISCHARGE DIAGNOSES:  1. Fall and confusion due to acute encephalopathy, likely multifactorial including due to progressive liver failure and multiple electrolyte imbalances, possible pneumonia, and possible spontaneous bacterial peritonitis.  2. Acute renal failure due to third spacing, now resolved.  3. Hypernatremia due to free water deficit, now resolved.  4. Progressive liver disease with hepatitis C, alcohol abuse in the past, prognosis is very poor.  5. Ascites status post paracentesis.  6. 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  7. Depression.  8. Chronic pain.  9. Failure to thrive.  10. Leukocytosis, possibly due to pneumonia, possibly due to spontaneous bacterial peritonitis, now leukocytes have been trending down and treated with empiric antibiotics with Invanz.  11. Possible seizure, started on Keppra by neurology.   CONSULTANTS: 1. Lynnae Prude, MD - Gastroenterology. 2. Palliative Care. 3. Hospice. 4. Erin Fulling, MD. 5. Mellody Drown, MD - Neurology.  PERTINENT LABS AND EVALUATIONS: CT scan of the head showed mild involutional changes.   Ammonia was 50 on admission. Troponin was negative. INR 1.8. Glucose 162, BUN 115, creatinine 1.46, sodium 139, potassium 3.4, chloride 103, CO2 24, and calcium 8.6. LFTs: Total bilirubin 5.1, alkaline phosphatase 72, AST 27, AST 65, and albumin 2.8. WBC count 12.5, hemoglobin 9.6, and platelet count 118,000.   Chest x-ray showed no acute changes.   Urinalysis was negative.   Ammonia 61 on 11/24/2011. INR was 1.8 on 11/25/2011.   ABG on 11/27/2011 showed a pH of 7.4, pCO2 37, and bicarbonate 22.   CT scan of the head on 11/28/2011 showed involutional changes.  Urine cultures were negative on 11/29/2011.  Chest x-ray on 12/01/2011 showed right  internal jugular catheter, mild edema with bibasilar atelectasis versus infiltrate.  Stool for C. difficile was negative on 12/03/2011.   Most recent WBC count today is 8.9, hemoglobin 8.9, and platelet count 112. BMP: Glucose on 12/10/2011 is 100, BUN 16, creatinine 1.13, sodium 137, potassium 3.8, chloride 106, and calcium 8.0. Most recent ammonia level was 59, 12/07/2011.   Ultrasound-guided paracentesis on 11/27/2011 showed approximately 6 liters of straw-colored fluid that was drained.   Ultrasound of the abdomen on 12/04/2011 showed cholelithiasis without acute cholecystitis. Cirrhotic liver change with large amount of ascites. Bilateral pleural effusions.   HOSPITAL COURSE: Please see the history and physical done by the admitting physician. The patient is a 59 year old unfortunate white female with current diagnosis of hepatitis C as well as alcohol abuse in the past who presented with progressive liver failure who was brought to the hospital after a fall and was admitted secondary to weakness. The patient initially was not as confused but then she started to get more weakness and progressively got confused and started having hallucinations. She was transferred to the Intensive Care Unit and placed on Precedex drip. At that time consultation with Dr. Mechele Collin was done. At that time it was suspected that she may have be having delirium tremens; however, the patient apparently had not been drinking since November. Neurological consult was done by Dr. Regino Schultze and followed by Dr. Katrinka Blazing. EEG showed some slowing, which could be found with toxic metabolic encephalopathy. The patient did have a tongue bite, therefore, she was empirically started on Keppra. The patient was also start on octreotide drip for a couple of days with possibility of GI bleed,  which was subsequently stopped and hemoglobin remained stable. The patient was seen in consultation by Dr. Harriett SineNancy Phifer and Dr. Erin FullingKurian Kasa from critical care  saw the patient is well. The patient's mental status has been waxing and waning. Overall it is better compared to the way she was, but continues to have intermittent confusion. I feel that this is likely a result of her progressive liver failure. Her prognosis is very grim. She has been treated for possible hepatic encephalopathy with lactulose and Xifaxan and that has seemed  to have helped some. The patient also had significant ascites for which she underwent paracentesis for. She is reaccumulating fluid in her abdomen. The patient also was noted to have leukocytosis. Possible cause was thought to be related to possible aspiration pneumonia, possible spontaneous bacterial peritonitis. She was treated with Invanz. Her WBC count has now normalized. The patient has been seen by palliative care and was made DO NOT RESUSCITATE. There was a point where she was not improving and hospice home was brought up, but then the patient started to improve somewhat. Therefore, now she is being discharged to a skilled nursing facility. The patient's daughter has been kept up to date in her condition and has agreed with the current plan. I have also discussed with her that it is likely that the patient may need recurrent admissions to the hospital for this intermittent confusion as well as other complications from her liver cirrhosis. She understands that if this continues to be a problem she may need to consider possible hospice home.   DISCHARGE MEDICATIONS:  1. Spironolactone 25 mg 1 tab p.o. twice a day. 2. Bupropion 150 mg 1 tab p.o. twice a day. 3. Multivitamin p.o. daily.  4. Celexa 20 mg daily.  5. Lactulose 30 mL p.o. twice a day. Hold for greater than three stools. 6. Percocet 5/325 mg p.o. every 8 hours p.r.n. pain.  7. Xifaxan 550 mg p.o. twice a day. 8. Lasix 20 mg daily.  9. Protonix 40 mg p.o. twice a day. 10. Keppra 500 mg p.o. twice a day.  HOME OXYGEN: None.   DIET: Low sodium, soft, easy to chew  diet.   ACTIVITY: As tolerated with assistance. Advance as tolerated.   EVALUATIONS: PT and OT and hospice to follow the patient at the skilled nursing facility.   TIMEFRAME FOR FOLLOW UP: Followup in 1 to 2 weeks with Dr. Juanetta GoslingHawkins and followup with Dr. Mechele CollinElliott in 2 to 3 weeks.  Dr. Mechele CollinElliott to decide whether the patient would benefit from a catheter for recurrent ascites.  TIME SPENT ON DISCHARGE: 40 minutes. ____________________________ Lacie ScottsShreyang H. Allena KatzPatel, MD shp:slb D: 12/10/2011 13:56:00 ET T: 12/10/2011 14:06:57 ET JOB#: [id number]  cc: Alailah Safley H. Allena KatzPatel, MD, <Dictator> Charise CarwinSHREYANG H Deonne Rooks MD ELECTRONICALLY SIGNED 12/10/2011 15:27

## 2014-12-01 NOTE — Consult Note (Signed)
EGD with banding of esophageal varices done.  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  See op note for details.  I spoke to Dr. Cherylann RatelLateef the Hospitalist of the results of the procedure.  May stop octreotide, start nadolol low dose and follow.    Electronic Signatures: Scot JunElliott, Dahl Higinbotham T (MD)  (Signed on 09-May-13 12:25)  Authored  Last Updated: 09-May-13 12:25 by Scot JunElliott, Brendia Dampier T (MD)

## 2014-12-01 NOTE — Consult Note (Signed)
Patient very much improved, awake and carrying on normal conversation, feeding tube and rectal tube out. Pt still with foley and would like that out also.  On clear liquid diet.  I think you could advance to full liquids tomorrow.   Electronic Signatures: Scot JunElliott, Ramie Palladino T (MD)  (Signed on 29-Apr-13 19:24)  Authored  Last Updated: 29-Apr-13 19:24 by Scot JunElliott, Mahnoor Mathisen T (MD)

## 2014-12-01 NOTE — H&P (Signed)
PATIENT NAME:  Alyssa Soto, LAMPRON MR#:  [id number] DATE OF BIRTH:  1956/05/08  DATE OF ADMISSION:  12/14/2011  PRIMARY CARE PHYSICIAN: Unassigned.  PRIMARY GASTROENTEROLOGIST: Lynnae Prude, MD  SUBJECTIVE: This is a 59 year old female recently discharged on 12/11/2011 after a prolonged hospitalization that was complicated by hepatic encephalopathy, delirium tremens, progressive liver disease secondary to hepatitis C and prior history of alcohol abuse, tense ascites status post paracentesis of 6 liters of fluid, and possible seizure appearing on last admission, subsequently discharged to [facility] for rehab, who presents with hematemesis that started around midnight. The patient apparently had several episodes of vomiting bright red blood.  An NG tube was dropped in the ER that was positive for bright red blood.  The patient is confused and most of the history is provided by the daughter currently present by the bedside.  The patient keeps repeating that she would like to go home and does not quite understand what is going on at this time.  She had a complicated hospital course between 04/17 and 12/11/2011 during which the patient was admitted for progressive confusion and hallucinations. She was placed on a Precedex drip and was treated for delirium tremens. She also had an EEG done that showed toxic metabolic encephalopathy. There was also question of whether the patient had a seizure and she was started on Keppra.  There was also suspicion for GI bleeding, but her hemoglobin remained stable during her last hospitalization.  Because of her overall poor prognosis, the patient had a palliative care consultation and she was recommended to be a DNR, DNI; however, the patient improved and was discharged to rehabilitation without any institution of hospice services. Because of her decline today, the daughter is reconsidering hospice services at this time.   PAST MEDICAL HISTORY:  1. 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  2. Tense ascites status post paracentesis. 3. History of opioid dependence. 4. Chronic pain syndrome. 5. History of alcohol abuse.  PAST SURGICAL HISTORY:  1. Tonsillectomy. 2. Hysterectomy.   SOCIAL HISTORY: Previous history of alcohol abuse, but apparently has been sober since November. She smoked up until discharge to rehab.   DRUG ALLERGIES: Ampicillin, codeine, Tylenol with codeine.   HOME MEDICATIONS: 1. Aldactone 25 mg one tablet p.o. twice a day. 2. Bupropion 150 mg one tablet p.o. twice a day. 3. Multivitamin one tablet p.o. daily. 4. Celexa 20 mg p.o. daily. 5. Lactulose 30 mL p.o. twice a day. 6. Percocet 5/325 mg p.o. every 8 hours p.r.n. 7. Xifaxan 550 mg p.o. twice a day. 8. Lasix 20 mg p.o. daily. 9. Protonix 40 mg p.o. twice a day. 10. Keppra 500 mg p.o. daily.  REVIEW OF SYSTEMS: CONSTITUTIONAL: Confused. Continues to lose weight. Fatigue. EYES: Denies any double vision or blurry vision. No glaucoma or cataracts. ENT: No tinnitus, no ear pain, no hearing loss, and no epistaxis. RESPIRATORY: No cough, wheezing, dyspnea, or asthma. CARDIOVASCULAR: No chest pain, orthopnea, edema, or arrhythmias. GASTROINTESTINAL: Hematemesis. Abdominal pain secondary to tense ascites. Hematochezia. GENITOURINARY: No dysuria, hematuria, renal colic, or frequency. ENDOCRINE: No polyuria, nocturia, or thyroid problems. HEME: No easy bruising or bleeding. NEURO: No numbness, weakness, dysarthria, or tremors. PSYCHIATRIC: Confused, anxious, and delirious.   PHYSICAL EXAMINATION:  VITALS:  Blood pressure 146/78, pulse 66.  GENERAL: Confused, otherwise comfortable and in no acute cardiopulmonary distress.  HEENT: Pupils are equal and reactive. Sclerae icteric.   NECK: Supple. No JVD. NG tube in  place.   LUNGS: Minimal bibasilar crackles at the bases.  CARDIOVASCULAR: Regular rate and rhythm.  No murmurs,  rubs or gallops.   ABDOMEN: Tense ascites. Decreased bowel sounds. Spider angiomata.   EXTREMITIES:  No cyanosis, clubbing, or edema.   NEURO: Confused. Cranial nerves II through XII are grossly nonfocal.   LABS/STUDIES: Ammonia level less than 25.    Urinalysis: Trace amount of leukocyte esterase.   INR 1.5. Lipase 479. Anion gap 9. Glucose 106, BUN 25, creatinine 1.36, sodium 141, by mouth 4.0, chloride 106, bicarbonate 26, and calcium 8.2.  WBC 11.6, hemoglobin 8.9, hematocrit 26.5, and platelet count 181.   ASSESSMENT AND PLAN:  1. Hematemesis: I discussed with Dr. Mechele CollinElliott. The patient will be initiated on a Protonix drip an octreotide drip. Cut off for transfusion of FFP is an INR of greater than 1.5 and a platelet count of less than 50,000; therefore, we will hold off on FFP transfusion at this point. Serial CBCs will be obtained. She has already been typed and screened. Per discussion with Dr. Mechele CollinElliott, we will hold off on EGD at this time. We will discontinue the nasogastric tube.  2. Confusion: High risk for hepatic encephalopathy. Therefore, the patient will be empirically started on Rocephin for SBE prophylaxis. We will also continue her lactulose, although her ammonia level is stable at this time.  3. Ascites: Hold her diuretics. At this time, she has pan-ascites and may be considered for another ultrasound paracentesis when she is stable from her GI bleed.  She is not acutely short of breath at this time.  4. Endstage cirrhosis:  [facility]; however, because of her progressive disease she is now a hospice candidate.  5. Questionable seizure during last admission: She was started on Keppra. We will continue this IV.        6. Disposition: she is a DNR, DNI and is being admitted to the ICU because of her hematemesis.  ____________________________ Richarda OverlieNayana Syrai Gladwin, MD na:slb D: 12/14/2011 05:05:07 ET T: 12/14/2011 07:06:24  ET JOB#: [id number]  cc: Richarda OverlieNayana Pike Scantlebury, MD, <Dictator> Scot Junobert T. Elliott, MD Richarda OverlieNAYANA Donye Campanelli MD ELECTRONICALLY SIGNED 01/19/2012 0:12

## 2014-12-01 NOTE — Consult Note (Signed)
Pt in florrid DT over weekend.  responded to meds.  Now Precedex stopped.  Neurologist consulted requested feeding and lactulose  so Dubbhof inserted by nurse and location confirmed with x-ray.  Will start feeding and dilute lactulose with flushes.  No new suggestions.  Ab distended some bowel sounds present.  Electronic Signatures: Scot JunElliott, Robert T (MD)  (Signed on 23-Apr-13 16:57)  Authored  Last Updated: 23-Apr-13 16:57 by Scot JunElliott, Robert T (MD)

## 2014-12-01 NOTE — Consult Note (Signed)
VSS afebrile, T max 99.7.  Pt responding to verbal questions better than any time in last 7 days.  Abd tight and bowel sounds decreased.  Rectal tube, foley, Dubhoff all in place.  Radiology may balk but consider a tap next week if she is still here.  May require FFP to do this.  Dr. Niel HummerIftikhar will be on this weekend and will not see her unless you call with a problem.  Electronic Signatures: Scot JunElliott, Krishon Adkison T (MD)  (Signed on 26-Apr-13 18:25)  Authored  Last Updated: 26-Apr-13 18:25 by Scot JunElliott, Suri Tafolla T (MD)

## 2014-12-01 NOTE — Discharge Summary (Signed)
PATIENT NAME:  Alyssa Soto, Alyssa Soto MR#:  [id number] DATE OF BIRTH:  1955-12-03  DATE OF ADMISSION:  11/25/2011 DATE OF DISCHARGE:  12/11/2011   ADDENDUM:   Discharge dictation was done by Dr. Auburn BilberryShreyang Patel this morning. The patient was seen by me. Her vitals this morning revealed temperature of 98.2, pulse 84, respirations 18, blood pressure 110/65, sats 96% on room air.   DISPOSITION: The patient will be discharged to [facility].  CODE STATUS: DO NOT RESUSCITATE.  The rest of the discharge summary is as per Dr. Auburn BilberryShreyang Patel done this morning including discharge medications as before by Dr. Auburn BilberryShreyang Patel.    TIME SPENT: Less than 30 minutes.   ____________________________ Katha HammingSnehalatha Nereyda Bowler, MD sk:drc D: 12/11/2011 12:15:28 ET T: 12/11/2011 12:29:55 ET JOB#: [id number]  cc: Katha HammingSnehalatha Adama Ferber, MD, <Dictator> Katha HammingSNEHALATHA Skylee Baird MD ELECTRONICALLY SIGNED 12/18/2011 22:53
